# Patient Record
Sex: Female | Born: 1990
Health system: Southern US, Community
[De-identification: ages and names within clinical notes are randomized; demographics above are authoritative.]

## PROBLEM LIST (undated history)

## (undated) ENCOUNTER — Inpatient Hospital Stay (HOSPITAL_COMMUNITY): Payer: Self-pay

## (undated) DIAGNOSIS — R000 Tachycardia, unspecified: Secondary | ICD-10-CM

## (undated) DIAGNOSIS — E1151 Type 2 diabetes mellitus with diabetic peripheral angiopathy without gangrene: Secondary | ICD-10-CM

## (undated) DIAGNOSIS — E049 Nontoxic goiter, unspecified: Secondary | ICD-10-CM

## (undated) DIAGNOSIS — E109 Type 1 diabetes mellitus without complications: Secondary | ICD-10-CM

## (undated) DIAGNOSIS — E11649 Type 2 diabetes mellitus with hypoglycemia without coma: Secondary | ICD-10-CM

## (undated) DIAGNOSIS — B009 Herpesviral infection, unspecified: Secondary | ICD-10-CM

## (undated) DIAGNOSIS — E1043 Type 1 diabetes mellitus with diabetic autonomic (poly)neuropathy: Secondary | ICD-10-CM

## (undated) DIAGNOSIS — E0590 Thyrotoxicosis, unspecified without thyrotoxic crisis or storm: Secondary | ICD-10-CM

## (undated) DIAGNOSIS — E1042 Type 1 diabetes mellitus with diabetic polyneuropathy: Secondary | ICD-10-CM

## (undated) DIAGNOSIS — R5600 Simple febrile convulsions: Secondary | ICD-10-CM

## (undated) HISTORY — DX: Tachycardia, unspecified: R00.0

## (undated) HISTORY — DX: Type 1 diabetes mellitus without complications: E10.9

## (undated) HISTORY — DX: Simple febrile convulsions: R56.00

## (undated) HISTORY — DX: Type 2 diabetes mellitus with hypoglycemia without coma: E11.649

## (undated) HISTORY — DX: Nontoxic goiter, unspecified: E04.9

## (undated) HISTORY — DX: Type 1 diabetes mellitus with diabetic autonomic (poly)neuropathy: E10.43

## (undated) HISTORY — DX: Type 2 diabetes mellitus with diabetic peripheral angiopathy without gangrene: E11.51

## (undated) HISTORY — PX: ABCESS DRAINAGE: SHX399

## (undated) HISTORY — PX: OTHER SURGICAL HISTORY: SHX169

## (undated) HISTORY — DX: Type 1 diabetes mellitus with diabetic polyneuropathy: E10.42

---
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
## 2012-01-04 ENCOUNTER — Ambulatory Visit: Payer: BC Managed Care – PPO | Admitting: "Endocrinology

## 2012-01-22 ENCOUNTER — Encounter: Payer: Self-pay | Admitting: "Endocrinology

## 2012-01-22 DIAGNOSIS — E11649 Type 2 diabetes mellitus with hypoglycemia without coma: Secondary | ICD-10-CM | POA: Insufficient documentation

## 2012-01-22 DIAGNOSIS — R5600 Simple febrile convulsions: Secondary | ICD-10-CM | POA: Insufficient documentation

## 2012-01-22 DIAGNOSIS — E1151 Type 2 diabetes mellitus with diabetic peripheral angiopathy without gangrene: Secondary | ICD-10-CM | POA: Insufficient documentation

## 2012-01-22 DIAGNOSIS — R000 Tachycardia, unspecified: Secondary | ICD-10-CM | POA: Insufficient documentation

## 2012-01-22 DIAGNOSIS — E1042 Type 1 diabetes mellitus with diabetic polyneuropathy: Secondary | ICD-10-CM | POA: Insufficient documentation

## 2012-01-22 DIAGNOSIS — E1043 Type 1 diabetes mellitus with diabetic autonomic (poly)neuropathy: Secondary | ICD-10-CM | POA: Insufficient documentation

## 2012-01-22 DIAGNOSIS — E109 Type 1 diabetes mellitus without complications: Secondary | ICD-10-CM | POA: Insufficient documentation

## 2012-01-22 DIAGNOSIS — E049 Nontoxic goiter, unspecified: Secondary | ICD-10-CM | POA: Insufficient documentation

## 2012-01-22 NOTE — Progress Notes (Addendum)
Subjective:  Patient Name: [name] Date of Birth: 09/16/1991  MRN: [id number]  [name]  presents to the office today for follow-up evaluation and management of her type 1 diabetes mellitus, hypoglycemia, goiter, peripheral neuropathy, autonomic neuropathy, tachycardia, and angiopathy.  HISTORY OF PRESENT ILLNESS:   [name] is a 21 y.o. Caucasian young woman.   [name] was accompanied by her maternal grandmother.  1. The patient was first referred to me on 11/11/09 by Dr. Rollen Sox, of [facility], for evaluation and management of type 1 diabetes mellitus. The patient was 21 years old.   A. Just before her [age] birthday, the patient was diagnosed with new onset type 1 diabetes mellitus. She was admitted to [facility] and then referred to the [facility] pediatric endocrine clinic. She went on a Cozmo pump at about age 12. At about age 47 she had problems with the pump and stopped pump therapy. At the time of her first visit with me she was taking 17 units of Lantus at night most days, but only 16 units per night during menses. Her insulin carbohydrate ratio for NovoLog was one unit for every 10 g of carbohydrates. Her insulin sensitivity factor for NovoLog was one unit for every 100 points above 150. She often adjusted NovoLog doses to preclude hypoglycemia occurring during activity. Past medical history was positive for IgA nephropathy that was in remission. She had had a renal biopsy at age 24. She also had had incision and drainage of a staphylococcal facial abscess. She underwent menarche at age 2/12. Her cycles were regular. She was on enalapril for renal protection. Family history was positive for type 2 diabetes mellitus in the paternal grandfather and paternal great-grandmother. Maternal grandmother took thyroid medication for hypothyroidism. She never had any thyroid surgery or radiation to her thyroid. Both the maternal grandmother and the paternal  grandfather had MIs. The father had a stroke at age 17. Paternal grandfather also had a stroke. The father reportedly had hypertension and severe carotid artery disease.  B. On examination, her height was at the 35th percentile and her weight was at the 50th percentile. Her blood pressure was 140/80. Her hemoglobin A1c was 8.9%. She had a 20-25 g goiter. She had trace DP and PT pulses. Sensation to touch was slightly decreased in both heels. Sensation to monofilament was slightly decreased in the right heel. Laboratory test were then performed. CMP was normal. Cholesterol was 178, triglycerides 69, HDL 55, LDL 109. Her TSH was 2.31. Free T4 was 1.2. Free T3 was 2.8. Her microalbumin/creatinine ratio was 123.2 (normal less than 30). I put her on our small graduated snack plan. I asked her to subtract 50-100 points from the next blood sugar when she exercised. 2. During the past two years, her blood sugars have generally been higher and more erratic than I wished. She has also had frequent low blood sugars. On 06/04/10 I changed her NovoLog plan. Her correction dose was one unit for every 30 points of blood sugar greater than 150. Her food dose was one unit unit for every 10 g of carbohydrates. She remained on the small bedtime snack plan and on 17 units of Lantus. The patient's last [facility] visit was on 03/02/11. At that visit, her physician assistant changed her to a correction dose of one unit for every 50 points of blood sugar greater than 150 due to concerns about frequent hypoglycemia. In the interim, she has been seeing an allergist, Dr. Oren Bracket, in  [place]. She is allergic to most environmentals. She had to be hospitalized for a severe case of urticaria. She now takes Allegra, Zyrtec, and a nasal spray. She remains on Lantus 17 units at bedtime and NovoLog by her 2 component method. She has had recurrent tonsillitis and strep throats, so is scheduled to have a tonsillectomy performed in the near future.  She'll also have 4 wisdom teeth removed as well. 3. Pertinent Review of Systems:  Constitutional: The patient feels "good ". The patient seems healthy and active. Eyes: Vision seems to be good. There are no recognized eye problems. She had a dilated eye exam 2 weeks ago. Neck: The patient has no complaints of anterior neck swelling, soreness, tenderness, pressure, discomfort, or difficulty swallowing.   Heart: Heart rate increases with exercise or other physical activity. The patient has no complaints of palpitations, irregular heart beats, chest pain, or chest pressure.   Gastrointestinal: Bowel movents seem normal. The patient has no complaints of excessive hunger, acid reflux, upset stomach, stomach aches or pains, diarrhea, or constipation.  Legs: Muscle mass and strength seem normal. There are no complaints of numbness, tingling, burning, or pain. No edema is noted.  Feet: There are no obvious foot problems. There are no complaints of numbness, tingling, burning, or pain. No edema is noted. Neurologic: There are no recognized problems with muscle movement and strength, sensation, or coordination. GYN: LMP was 2 days ago. She's had irregular cycles and irregular uterine bleeding. Hypoglycemia: She had a few low blood sugars in the mornings, but none have been severe. 4. BG printout: The patient may go 8 days without checking blood sugars. She almost always misses her bedtime blood sugar checks. Blood sugars tend to be higher during menses.   PAST MEDICAL, FAMILY, AND SOCIAL HISTORY  Past Medical History  Diagnosis Date  . Type 1 diabetes mellitus in patient age 58-19 years with HbA1C goal below 7.5   . Hypoglycemia associated with diabetes   . Goiter   . Diabetic peripheral neuropathy associated with type 1 diabetes mellitus   . Type 1 diabetes mellitus with diabetic autonomic neuropathy   . Diabetic peripheral angiopathy   . Tachycardia   . Febrile seizures     Seizures occurred at  about 33/-45 months of age.    Family History  Problem Relation Age of Onset  . Hypertension Father   . Thyroid disease Maternal Grandmother     Hypothyroid and takes thyroid medication. No prior thyroid surgery her thyroid  radiation.  Marland Kitchen Heart disease Maternal Grandmother   . Diabetes Paternal Grandfather     T2 DM  . Heart disease Paternal Grandfather     Current outpatient prescriptions:Fexofenadine HCl (ALLEGRA PO), Take by mouth.  , Disp: , Rfl: ;  insulin aspart (NOVOLOG PENFILL) 100 UNIT/ML injection, Inject 25 Units into the skin 4 (four) times daily -  before meals and at bedtime., Disp: 15 mL, Rfl: 6;  Insulin Aspart (NOVOLOG [place]), Inject into the skin.  , Disp: , Rfl:  insulin glargine (LANTUS SOLOSTAR) 100 UNIT/ML injection, Inject 50 Units into the skin 1 day or 1 dose., Disp: 15 mL, Rfl: 6;  LANTUS SOLOSTAR 100 UNIT/ML injection, INJECT 17 UNITS AT BEDTIME OR AS DIRECTED, Disp: 15 Syringe, Rfl: 1  Allergies as of 10/04/2011 - Review Complete 10/04/2011  Allergen Reaction Noted  . Sulfa antibiotics Nausea And Vomiting 04/30/2011     reports that she has never smoked. She has never used smokeless tobacco. Pediatric History  Patient Guardian Status  . Mother:  Hill,Melissa   Other Topics Concern  . Not on file   Social History Narrative  . No narrative on file    1. School and Family: She will start [employer] in January. 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  2. Activities: She is not involved in any regular physical activities.  3. Primary Care Provider: Dr. Sharman Crate Hamrick  ROS: There are no other significant problems involving [place]'s other body systems.   Objective:  Vital Signs:  BP 112/78  Pulse 116  Ht 5' 3.62" (1.616 m)  Wt 117 lb 6.4 oz (53.252 kg)  BMI 20.39 kg/m2   Ht Readings from Last 3 Encounters:  10/04/11 5' 3.62" (1.616 m) (40.12%*)   * Growth percentiles are based on CDC 2-20  Years data.   Wt Readings from Last 3 Encounters:  10/04/11 117 lb 6.4 oz (53.252 kg) (32.95%*)   * Growth percentiles are based on CDC 2-20 Years data.   Body surface area is 1.55 meters squared. 40.12%ile based on CDC 2-20 Years stature-for-age data. 32.95%ile based on CDC 2-20 Years weight-for-age data.    PHYSICAL EXAM:  Constitutional: The patient appears healthy and well nourished. The patient's height and weight are normal for age. She is bright and alert. Head: The head is normocephalic. Face: The face appears normal.  Eyes: There is no obvious arcus or proptosis. Moisture appears normal. Mouth: The oropharynx and tongue appear normal. Oral moisture is normal. Neck: The neck appears to be visibly normal. No carotid bruits are noted. The thyroid gland is 20-25 grams in size. The left lobe is larger than the right lobe. The thyroid gland is relatively firm. The thyroid gland is not tender to palpation. Lungs: The lungs are clear to auscultation. Air movement is good. Heart: Heart rate and rhythm are regular. Heart sounds S1 and S2 are normal. I did not appreciate any pathologic cardiac murmurs. Abdomen: The abdomen appears to be normal in size for the patient's age. Bowel sounds are normal. There is no obvious hepatomegaly, splenomegaly, or other mass effect.  Arms: Muscle size and bulk are normal for age. Hands: There is no obvious tremor. Phalangeal and metacarpophalangeal joints are normal. Palmar muscles are normal for age. Palmar skin is normal. Palmar moisture is also normal. Legs: Muscles appear normal for age. No edema is present. Feet: Feet are normally formed. She has a healing abrasion of the right medial foot. Dorsalis pedal pulses are normal 1+ bilaterally. Neurologic: Strength is normal for age in both the upper and lower extremities. Muscle tone is normal. Sensation to touch is normal in both legs, but slightly decreased in the right heel.    LAB DATA: Hemoglobin  A1c today was 7.0%.   Assessment and Plan:   ASSESSMENT:  1. Type 1 diabetes mellitus: Hemoglobin A1c is surprisingly much better at this visit. She still has some big gaps in her BG printout.  2. Hypoglycemia: This occurs only occasionally. I suspect that she is having more frequent low blood sugars than she recognizes. 3. Goiter: Essentially unchanged in size from the last several visits. 4. Hashimoto's disease: Her thyroiditis is clinically quiescent. 5. Peripheral neuropathy: This is mild. 6.  Autonomic neuropathy and tachycardia: These problems are essentially unchanged. 7. Hypothyroidism: Her TSH test in July, ordered by Dr Barnetta Chapel, was slightly elevated, consistent with hypothyroidism. Since we know she has Hashimoto's disease, the  elevated TSH may have followed a flare-up of thyroiditis and may have returned to normal or may remain elevated, indicating permanent hypothyroidism. We need to re-check her TFTs.  PLAN:  1. Diagnostic: CMP, lipid panel, TFTs, TPO antibody, and urinary microalbumin/creatinine ratio per 2. Therapeutic: Continue Lantus at 17 units. Check BG's and take appropriate correction doses of NovoLog. 3. Patient education: I talked with her again about converting to an insulin pump. She is still not interested. 4. Follow-up: Return in about 3 months (around 01/04/2012).   Level of Service: This visit lasted in excess of 40 minutes. More than 50% of the visit was devoted to counseling.  David Stall, MD  ADDENDUM 1. Lab results from 10/04/11: CMP was normal except for a glucose of 215. Cholesterol was 179, triglycerides 79, HDL 57, and LDL 106. TSH was 0.8-8. Free T4 was 1.22. Free T3 was 2.9. Her urinary microalbumin/creatinine ratio was 49.8. 2. Her lipid panel is essentially the same as it was back and 11/15/09. I believe that if she gets the blood sugars under better control, her lipid levels will improve. 3. Her thyroid tests are now within normal. If  anything her TSH is now on the low end of the normal range. Her free T4 and free T3 are essentially the same as they were on 11/12/11/11/10, but have fluctuated in between. As noted above, the swings in thyroid tests are consistent with flareups of thyroiditis. 4. Her urinary microalbumin/creatinine ratio is elevated, but it is markedly improved from 85.7 and on 11/01/10 and 123.2 on 11/11/09. She stopped enalapril therapy in late 2011 due to concerns that it made her dizzy. Our PA tried to re-start her on a half-dose in November, but the patient did not do so. She needs to resume ACE inhibitor therapy or begun ARB therapy. David Stall

## 2012-01-23 ENCOUNTER — Encounter: Payer: Self-pay | Admitting: "Endocrinology

## 2012-02-03 ENCOUNTER — Telehealth: Payer: Self-pay | Admitting: "Endocrinology

## 2012-02-03 ENCOUNTER — Ambulatory Visit (INDEPENDENT_AMBULATORY_CARE_PROVIDER_SITE_OTHER): Payer: BC Managed Care – PPO | Admitting: "Endocrinology

## 2012-02-03 ENCOUNTER — Ambulatory Visit: Payer: BC Managed Care – PPO | Admitting: Pediatric Endocrinology

## 2012-02-03 ENCOUNTER — Encounter: Payer: Self-pay | Admitting: "Endocrinology

## 2012-02-03 VITALS — BP 118/82 | HR 118 | Wt 118.0 lb

## 2012-02-03 DIAGNOSIS — E1142 Type 2 diabetes mellitus with diabetic polyneuropathy: Secondary | ICD-10-CM

## 2012-02-03 DIAGNOSIS — N058 Unspecified nephritic syndrome with other morphologic changes: Secondary | ICD-10-CM

## 2012-02-03 DIAGNOSIS — E1149 Type 2 diabetes mellitus with other diabetic neurological complication: Secondary | ICD-10-CM

## 2012-02-03 DIAGNOSIS — E1169 Type 2 diabetes mellitus with other specified complication: Secondary | ICD-10-CM

## 2012-02-03 DIAGNOSIS — IMO0002 Reserved for concepts with insufficient information to code with codable children: Secondary | ICD-10-CM

## 2012-02-03 DIAGNOSIS — E1129 Type 2 diabetes mellitus with other diabetic kidney complication: Secondary | ICD-10-CM

## 2012-02-03 DIAGNOSIS — E049 Nontoxic goiter, unspecified: Secondary | ICD-10-CM

## 2012-02-03 DIAGNOSIS — R809 Proteinuria, unspecified: Secondary | ICD-10-CM | POA: Insufficient documentation

## 2012-02-03 DIAGNOSIS — E1065 Type 1 diabetes mellitus with hyperglycemia: Secondary | ICD-10-CM

## 2012-02-03 DIAGNOSIS — I10 Essential (primary) hypertension: Secondary | ICD-10-CM | POA: Insufficient documentation

## 2012-02-03 DIAGNOSIS — G609 Hereditary and idiopathic neuropathy, unspecified: Secondary | ICD-10-CM

## 2012-02-03 DIAGNOSIS — E063 Autoimmune thyroiditis: Secondary | ICD-10-CM

## 2012-02-03 DIAGNOSIS — E1143 Type 2 diabetes mellitus with diabetic autonomic (poly)neuropathy: Secondary | ICD-10-CM

## 2012-02-03 DIAGNOSIS — E038 Other specified hypothyroidism: Secondary | ICD-10-CM

## 2012-02-03 DIAGNOSIS — E11649 Type 2 diabetes mellitus with hypoglycemia without coma: Secondary | ICD-10-CM

## 2012-02-03 DIAGNOSIS — G909 Disorder of the autonomic nervous system, unspecified: Secondary | ICD-10-CM

## 2012-02-03 DIAGNOSIS — R000 Tachycardia, unspecified: Secondary | ICD-10-CM

## 2012-02-03 DIAGNOSIS — IMO0001 Reserved for inherently not codable concepts without codable children: Secondary | ICD-10-CM

## 2012-02-03 DIAGNOSIS — E1165 Type 2 diabetes mellitus with hyperglycemia: Secondary | ICD-10-CM

## 2012-02-03 LAB — POCT GLYCOSYLATED HEMOGLOBIN (HGB A1C): Hemoglobin A1C: 11.8

## 2012-02-03 MED ORDER — LISINOPRIL 2.5 MG PO TABS: 2.5000 mg | ORAL_TABLET | Freq: Every day | ORAL | Status: DC

## 2012-02-03 MED ORDER — INSULIN GLARGINE 100 UNIT/ML ~~LOC~~ SOLN: 18.0000 [IU] | SUBCUTANEOUS | Status: DC

## 2012-02-03 MED ORDER — INSULIN ASPART 100 UNIT/ML ~~LOC~~ SOLN: 25.0000 [IU] | Freq: Three times a day (TID) | SUBCUTANEOUS | Status: DC

## 2012-02-03 MED ORDER — GLUCOSE BLOOD VI STRP: ORAL_STRIP | Status: DC

## 2012-02-03 NOTE — Patient Instructions (Signed)
Followup visit in 3 months. Please check blood glucose values in all mealtimes and at bedtime. Please take Lantus and NovoLog insulin as prescribed.

## 2012-02-03 NOTE — Progress Notes (Addendum)
Subjective:  Patient Name: [name] Date of Birth: 05-26-1991  MRN: [id number]  [name]  presents to the office today for follow-up evaluation and management of her type 1 diabetes mellitus, hypoglycemia, goiter, peripheral neuropathy, autonomic neuropathy, tachycardia, and angiopathy.  HISTORY OF PRESENT ILLNESS:   [name] is a 21 y.o. Caucasian young woman.   [name] was unaccompanied.   1. The patient was first referred to me on 11/11/09 by Dr. Rollen Sox, of [facility], for evaluation and management of type 1 diabetes mellitus. The patient was 21 years old.   A. Just before her [age] birthday, the patient was diagnosed with new onset type 1 diabetes mellitus. She was admitted to [facility] and then referred to the [facility] pediatric endocrine clinic. She went on a Cozmo pump at about age 76. At about age 38 she had problems with the pump and stopped pump therapy. At the time of her first visit with me she was taking 17 units of Lantus at night most days, but only 16 units per night during menses. Her insulin carbohydrate ratio for NovoLog was one unit for every 10 g of carbohydrates. Her insulin sensitivity factor for NovoLog was one unit for every 100 points above 150. She often adjusted NovoLog doses to preclude hypoglycemia occurring during activity. Past medical history was positive for IgA nephropathy that was in remission. She had had a renal biopsy at age 6. She also had had incision and drainage of a staphylococcal facial abscess. She underwent menarche at age 20/12. Her cycles were regular. She was on enalapril for renal protection. Family history was positive for type 2 diabetes mellitus in the paternal grandfather and paternal great-grandmother. Maternal grandmother took thyroid medication for hypothyroidism. She never had any thyroid surgery or radiation to her thyroid. Both the maternal grandmother and the paternal grandfather had MIs. The father  had a stroke at age 84. Paternal grandfather also had a stroke. The father reportedly had hypertension and severe carotid artery disease.  B. On examination, her height was at the 35th percentile and her weight was at the 50th percentile. Her blood pressure was 140/80. Her hemoglobin A1c was 8.9%. She had a 20-25 g goiter. She had trace DP and PT pulses. Sensation to touch was slightly decreased in both heels. Sensation to monofilament was slightly decreased in the right heel. Laboratory test were then performed. CMP was normal. Cholesterol was 178, triglycerides 69, HDL 55, LDL 109. Her TSH was 2.31. Free T4 was 1.2. Free T3 was 2.8. Her microalbumin/creatinine ratio was 123.2 (normal less than 30). I put her on our small graduated snack plan. I asked her to subtract 50-100 points from the next blood sugar when she exercised. 2. During the past two years, her blood sugars have generally been higher and more erratic than I wished. She has also had frequent low blood sugars. On 06/04/10 I changed her NovoLog plan. Her new correction dose was one unit for every 30 points of blood sugar greater than 150. Her new food dose was one unit unit for every 15 g of carbohydrates. She remained on the small bedtime snack plan and on 18 units of Lantus. The patient's last [facility] visit was on 10/04/11. She is still seeing an allergist, Dr. Oren Bracket, in [place]. She is allergic to most environmentals. She now takes her nasal spray regularly and her Allegra and Zyrtec as needed. She remains on Lantus 18 units at bedtime and NovoLog by her 2  component method using her Medical laboratory scientific officer. She has had recurrent tonsillitis and strep throats, so should have a tonsillectomy performed in the  future. She will probably have her 4 wisdom teeth removed in the future as well. 3. Pertinent Review of Systems:  Constitutional: The patient feels "pretty good ". The patient seems healthy and active. Eyes: Vision seems to be good. There are no  recognized eye problems. She had a dilated eye exam in November Neck: The patient has no complaints of anterior neck swelling, soreness, tenderness, pressure, discomfort, or difficulty swallowing.   Heart: Heart rate increases with exercise or other physical activity. The patient has no complaints of palpitations, irregular heart beats, chest pain, or chest pressure.   Gastrointestinal: Bowel movents seem normal. The patient has no complaints of excessive hunger, acid reflux, upset stomach, stomach aches or pains, diarrhea, or constipation.  Legs: Muscle mass and strength seem normal. There are no complaints of numbness, tingling, burning, or pain. No edema is noted.  Feet: She damaged her left great toe nail and the nail was mostly torn off. She applied an "antibacterial" nail polish and the area is slowly healing. The toenail fragment is gradually growing out. There are no complaints of numbness, tingling, burning, or pain. No edema is noted. Neurologic: There are no recognized problems with muscle movement and strength, sensation, or coordination. GYN: LMP was in October. She has an Implanon implant. Hypoglycemia: She had not had many low BGs. She is "terrified of low blood sugars" so she tends to keep her BGs higher than she should. At bedtime when she takes her Lantus, she usually eats more than her HS snack plan calls for in order to avoid hypoglycemia. 4. BG printout: She is checking her BGs much more frequently, mostly 3-4 times per day. She had one low BG of 65 3 weeks ago.  PAST MEDICAL, FAMILY, AND SOCIAL HISTORY  Past Medical History  Diagnosis Date  . Type 1 diabetes mellitus in patient age 30-19 years with HbA1C goal below 7.5   . Hypoglycemia associated with diabetes   . Goiter   . Diabetic peripheral neuropathy associated with type 1 diabetes mellitus   . Type 1 diabetes mellitus with diabetic autonomic neuropathy   . Diabetic peripheral angiopathy   . Tachycardia   . Febrile  seizures     Seizures occurred at about 63/-81 months of age.  . Diabetes mellitus type I     Family History  Problem Relation Age of Onset  . Hypertension Father   . Thyroid disease Maternal Grandmother     Hypothyroid and takes thyroid medication. No prior thyroid surgery her thyroid  radiation.  Marland Kitchen Heart disease Maternal Grandmother   . Diabetes Paternal Grandfather     T2 DM  . Heart disease Paternal Grandfather     Current outpatient prescriptions:insulin aspart (NOVOLOG PENFILL) 100 UNIT/ML injection, Inject 25 Units into the skin 4 (four) times daily -  before meals and at bedtime., Disp: 15 mL, Rfl: 6;  insulin glargine (LANTUS) 100 UNIT/ML injection, Inject 18 Units into the skin 1 day or 1 dose., Disp: , Rfl: ;  DISCONTD: insulin glargine (LANTUS SOLOSTAR) 100 UNIT/ML injection, Inject 50 Units into the skin 1 day or 1 dose., Disp: 15 mL, Rfl: 6 Fexofenadine HCl (ALLEGRA PO), Take by mouth.  , Disp: , Rfl: ;  Insulin Aspart (NOVOLOG Lemay), Inject into the skin.  , Disp: , Rfl: ;  LANTUS SOLOSTAR 100 UNIT/ML injection, INJECT 17 UNITS AT  BEDTIME OR AS DIRECTED, Disp: 15 Syringe, Rfl: 1  Allergies as of 02/03/2012 - Review Complete 02/03/2012  Allergen Reaction Noted  . Sulfa antibiotics Nausea And Vomiting 04/30/2011     reports that she has never smoked. She has never used smokeless tobacco. Pediatric History  Patient Guardian Status  . Mother:  Mckinney,Jennifer   Other Topics Concern  . Not on file   Social History Narrative   Lives with mom, stepdad, sister. Attends [employer].      1. School and Family: She has classes at [employer] from 8 AM to 1 PM Monday-Thursday. 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  2. Activities: She is not involved in any regular physical activities.  3. Primary Care Provider: Dr. Sharman Crate Hamrick  ROS: There are no other significant problems involving [name]'s other body systems.   Objective:  Vital Signs:  BP  118/82  Pulse 118  Wt 118 lb (53.524 kg)   Ht Readings from Last 3 Encounters:  10/04/11 5' 3.62" (1.616 m) (40.12%*)   * Growth percentiles are based on CDC 2-20 Years data.   Wt Readings from Last 3 Encounters:  02/03/12 118 lb (53.524 kg) (32.72%*)  10/04/11 117 lb 6.4 oz (53.252 kg) (32.95%*)   * Growth percentiles are based on CDC 2-20 Years data.   There is no height on file to calculate BSA. No height on file. 32.72%ile based on CDC 2-20 Years weight-for-age data.    PHYSICAL EXAM:  Constitutional: The patient appears healthy and well nourished. The patient's height and weight are normal for age. She is bright and alert. Head: The head is normocephalic. Face: The face appears normal.  Eyes: There is no obvious arcus or proptosis. Moisture appears normal. Mouth: The oropharynx and tongue appear normal. Oral moisture is normal. Neck: The neck appears to be visibly normal. No carotid bruits are noted. The thyroid gland is 20-25 grams in size. The left lobe is larger than the right lobe. The thyroid gland is relatively firm. The thyroid gland is not tender to palpation. Lungs: The lungs are clear to auscultation. Air movement is good. Heart: Heart rate and rhythm are regular. Heart sounds S1 and S2 are normal. I did not appreciate any pathologic cardiac murmurs. Abdomen: The abdomen appears to be normal in size for the patient's age. Bowel sounds are normal. There is no obvious hepatomegaly, splenomegaly, or other mass effect.  Arms: Muscle size and bulk are normal for age. Hands: There is no obvious tremor. Phalangeal and metacarpophalangeal joints are normal. Palmar muscles are normal for age. Palmar skin is normal. Palmar moisture is also normal. Legs: Muscles appear normal for age. No edema is present. Feet: Feet are normally formed. She has a Band-Aid around her left great toe covering the nail bed. Dorsalis pedal pulses are faint 1+ on the right and 1+ on the  left. Neurologic: Strength is normal for age in both the upper and lower extremities. Muscle tone is normal. Sensation to touch is normal in both legs, but slightly decreased in the right heel.    LAB DATA: Hemoglobin A1c today was 11.8%.         Lab data 10/04/11: CMP was normal except for glucose of 2:15. Cholesterol was 179, triglycerides 79, HDL 57, and LDL 106.   Assessment and Plan:   ASSESSMENT:  1. Type 1 diabetes mellitus: Hemoglobin A1c is much higher, but she has not been having the  low BG symptoms that she had before. She has had a large number of acceptable BGs. She is doing a much better job of checking BGs and taking better care of herself.  2. Hypoglycemia: This occurs only occasionally. The one documented episode occurred before lunch on a day that she had not checked BG at breakfast or eaten breakfast.   3. Goiter: Essentially unchanged in size from the last several visits.  4. Hashimoto's disease: Her thyroiditis is clinically quiescent. 5. Peripheral neuropathy: This is mild. 6.  Autonomic neuropathy and tachycardia: These problems are essentially unchanged. Both forms of neuropathy will correct if she can keep the BGs in mostly a near-normal range. 7. Hypothyroidism: Her TSH test in July, ordered by Dr Barnetta Chapel, was slightly elevated, consistent with hypothyroidism. She was euthyroid in October. Since we know she has Hashimoto's disease, the elevated TSH must have followed a flare-up of thyroiditis, but has since returned to normal. We need to recheck her TFTs every 3-6 months. She will become hypothyroid eventually. 8. Microalbuminuria: Although her microalbumin/creatinine ratio was elevated at 49.8 (normal less than 30), this is a marked improvement from a value of 123.2 on 11/15/09 and 85.7 on 10/25/10. It is possible that this degree of proteinuria is do to her previous IgA nephropathy rather than to diabetes alone. Treatment with an ACE inhibitor might help. 9.  Hypertension: Patient's diastolic blood pressure was slightly elevated today. She may benefit from an ACE inhibitor do to both its antihypertensive effect and its renal protective effect. She stopped using enalapril several years ago because of passing out.A lisinopril dose of 2.5 mg might work out well  PLAN:  1. Diagnostic: CMP, lipid panel, TFTs, TPO antibody, and urinary microalbumin/creatinine prior to next visit. 2. Therapeutic: Continue Lantus at 18 units. Check BG's and take appropriate correction doses of NovoLog. Follow the HS snack plan. Had lisinopril, 2.5 mg per day. 3. Patient education: I talked with her again about converting to an insulin pump. She is interested now. I gave her a [employer] pump packet. 4. Follow-up: 3 months   Level of Service: This visit lasted in excess of 40 minutes. More than 50% of the visit was devoted to counseling.  David Stall, MD

## 2012-02-03 NOTE — Telephone Encounter (Signed)
I tried to contact the patient on her cell phone, but she was not available. I did talk with her mother. I explained that with all of the trouble we had today trying to order her medications through the computer, I had forgotten to order the lisinopril, 2.5 mg/day. I explained to the mother that this is the lowest dose I can prescribe, so [name] is unlikely to have hypotension. If she does feel as if her BP is too low, however, she can always cut the pill in half. Mother stated that she would pass on the information to Balta.  David Stall

## 2012-03-07 ENCOUNTER — Ambulatory Visit: Payer: BC Managed Care – PPO | Admitting: "Endocrinology

## 2012-03-20 ENCOUNTER — Other Ambulatory Visit: Payer: Self-pay | Admitting: "Endocrinology

## 2012-04-12 ENCOUNTER — Other Ambulatory Visit: Payer: Self-pay | Admitting: *Deleted

## 2012-04-12 DIAGNOSIS — E1065 Type 1 diabetes mellitus with hyperglycemia: Secondary | ICD-10-CM

## 2012-04-12 DIAGNOSIS — E039 Hypothyroidism, unspecified: Secondary | ICD-10-CM

## 2012-05-03 ENCOUNTER — Ambulatory Visit (INDEPENDENT_AMBULATORY_CARE_PROVIDER_SITE_OTHER): Payer: BC Managed Care – PPO | Admitting: "Endocrinology

## 2012-05-03 ENCOUNTER — Encounter: Payer: Self-pay | Admitting: "Endocrinology

## 2012-05-03 VITALS — BP 123/77 | HR 125 | Wt 125.2 lb

## 2012-05-03 DIAGNOSIS — R809 Proteinuria, unspecified: Secondary | ICD-10-CM

## 2012-05-03 DIAGNOSIS — G909 Disorder of the autonomic nervous system, unspecified: Secondary | ICD-10-CM

## 2012-05-03 DIAGNOSIS — E1142 Type 2 diabetes mellitus with diabetic polyneuropathy: Secondary | ICD-10-CM

## 2012-05-03 DIAGNOSIS — E11649 Type 2 diabetes mellitus with hypoglycemia without coma: Secondary | ICD-10-CM

## 2012-05-03 DIAGNOSIS — R000 Tachycardia, unspecified: Secondary | ICD-10-CM

## 2012-05-03 DIAGNOSIS — G609 Hereditary and idiopathic neuropathy, unspecified: Secondary | ICD-10-CM

## 2012-05-03 DIAGNOSIS — E1149 Type 2 diabetes mellitus with other diabetic neurological complication: Secondary | ICD-10-CM

## 2012-05-03 DIAGNOSIS — E063 Autoimmune thyroiditis: Secondary | ICD-10-CM

## 2012-05-03 DIAGNOSIS — E1143 Type 2 diabetes mellitus with diabetic autonomic (poly)neuropathy: Secondary | ICD-10-CM

## 2012-05-03 DIAGNOSIS — E049 Nontoxic goiter, unspecified: Secondary | ICD-10-CM

## 2012-05-03 DIAGNOSIS — E1169 Type 2 diabetes mellitus with other specified complication: Secondary | ICD-10-CM

## 2012-05-03 DIAGNOSIS — E038 Other specified hypothyroidism: Secondary | ICD-10-CM

## 2012-05-03 DIAGNOSIS — E1065 Type 1 diabetes mellitus with hyperglycemia: Secondary | ICD-10-CM

## 2012-05-03 NOTE — Patient Instructions (Signed)
All up visit in 4 months. Please have lab tests done on next 3 weeks. In 3-4 weeks please bring in the BG meter for download. His let us know immediately if you decide to order an insulin pump.

## 2012-05-03 NOTE — Progress Notes (Addendum)
Subjective:  Patient Name: [name] Date of Birth: Sep 20, 1991  MRN: [id number]  [name]  presents to the office today for follow-up evaluation and management of her type 1 diabetes mellitus, hypoglycemia, goiter, peripheral neuropathy, autonomic neuropathy, tachycardia, and angiopathy.  HISTORY OF PRESENT ILLNESS:   [name] is a 21 y.o. Caucasian young woman.   [name] was unaccompanied.   1. The patient was first referred to me on 11/11/09 by Dr. Burnell Blanks, of [facility], for evaluation and management of type 1 diabetes mellitus. The patient was 21 years old.   A. Just before her [age] birthday, the patient was diagnosed with new onset type 1 diabetes mellitus. She was admitted to [facility] and then referred to the [facility] pediatric endocrine clinic. She went on a Cozmo pump at about age 10. At about age 43 she had problems with the pump and stopped pump therapy. At the time of her first visit with me she was taking 17 units of Lantus at night most days, but only 16 units per night during menses. Her insulin carbohydrate ratio for NovoLog was one unit for every 10 g of carbohydrates. Her insulin sensitivity factor for NovoLog was one unit for every 100 points above 150. She often adjusted NovoLog doses to preclude hypoglycemia occurring during activity. Past medical history was positive for IgA nephropathy that was in remission. She had had a renal biopsy at age 45. She also had had incision and drainage of a staphylococcal facial abscess. She underwent menarche at age 29/12. Her cycles were regular. She was on enalapril for renal protection. Family history was positive for type 2 diabetes mellitus in the paternal grandfather and paternal great-grandmother. Maternal grandmother took thyroid medication for hypothyroidism. The MGM had never had any thyroid surgery or radiation to her thyroid. Both the maternal grandmother and the paternal grandfather had MIs. The  father had a stroke at age 63. Paternal grandfather also had a stroke. The father reportedly had hypertension and severe carotid artery disease.  B. On examination, her height was at the 35th percentile and her weight was at the 50th percentile. Her blood pressure was 140/80. Her hemoglobin A1c was 8.9%. She had a 20-25 g goiter. She had trace DP and PT pulses. Sensation to touch was slightly decreased in both heels. Sensation to monofilament was slightly decreased in the right heel. Laboratory test were then performed. CMP was normal. Cholesterol was 178, triglycerides 69, HDL 55, LDL 109. Her TSH was 2.31. Free T4 was 1.2. Free T3 was 2.8. Her microalbumin/creatinine ratio was 123.2 (normal less than 30). I put her on our small graduated snack plan. I asked her to subtract 50-100 points from the next blood sugar when she exercised. 2. During the past three years, her blood sugars have generally been higher and more erratic than I wished. She has also had frequent low blood sugars. On 06/04/10 I changed her NovoLog plan. Her new correction dose was one unit for every 30 points of blood sugar greater than 150. Her new food dose was one unit for every 15 g of carbohydrates. She remained on the small bedtime snack plan and on 18 units of Lantus.  3. The patient's last [facility] visit was on 02/03/12. In the interim, the left great toe nail that had come off at last visit has not healed and may be infected. She has not yet had her planned tonsillectomy or wisdom teeth removal. She stopped the lisinopril because she had sensations  of uncomfortable floating and got lightheaded at work one day and passed out. These symptoms resolved after stopping the lisinopril. She also went to a trampoline park, developed bad back pain afterward, and went to urgent care. She was told she had back strain and a slight case of scoliosis. She reduced Lantus to 17 units at bedtime about one week ago due to hypoglycemia episodes. She remains on  NovoLog by her 2 component method using her Medical laboratory scientific officer.  4. Pertinent Review of Systems:  Constitutional: The patient feels "pretty good". The patient seems healthy and active. Eyes: Vision seems to be good. There are no recognized eye problems. She had a dilated eye exam in November. Neck: The patient has no complaints of anterior neck swelling, soreness, tenderness, pressure, discomfort, or difficulty swallowing.   Heart: Heart rate increases with exercise or other physical activity. The patient has no complaints of palpitations, irregular heart beats, chest pain, or chest pressure.   Gastrointestinal: She is often excessively hungry and queasy. Whenever she has low BG that requires treatment she notes nausea and epigastric pain that can last for several hours after the BG comes up. Bowel movents seem normal. The patient has no complaints of excessive hunger, acid reflux, upset stomach, stomach aches or pains, diarrhea, or constipation.  Legs: Muscle mass and strength seem normal. There are no complaints of numbness, tingling, burning, or pain. No edema is noted.  Feet: As above. There are no complaints of numbness, tingling, burning, or pain. No edema is noted. Neurologic: There are no recognized problems with muscle movement and strength, sensation, or coordination. GYN: LMP was in October. She has an Implanon implant. Hypoglycemia: She had not had many low BGs since reducing Lantus to 17 units at HS. When she has low BGs they occur in the mornings upon awakening.  She is still "terrified of low blood sugars" so she tends to keep her BGs higher than she should. At bedtime when she takes her Lantus, she usually eats more than her HS snack plan calls for in order to avoid hypoglycemia. 5. BG printout: We could not download her old Ultra meter today.  BGs: 0200, breakfast (She does not eat breakfast at all.), lunch, dinner, bedtime: She often guesstimates her Novolog doses at mealtimes. 05/03/12:  xxx, 182, 291/185 05/02/12: xxx, 196, xxx, 169, 255 5.27/13: xxx, xxx, 77, 138, 141 04/30/12: xxx, xxx, 148, xxx, 144 04/29/12: xxx, xxx, xxx, 509/ hands washed 192,139 04/28/12: xxx, xxx, 157, xxx, 169  PAST MEDICAL, FAMILY, AND SOCIAL HISTORY  Past Medical History  Diagnosis Date  . Type 1 diabetes mellitus in patient age 65-19 years with HbA1C goal below 7.5   . Hypoglycemia associated with diabetes   . Goiter   . Diabetic peripheral neuropathy associated with type 1 diabetes mellitus   . Type 1 diabetes mellitus with diabetic autonomic neuropathy   . Diabetic peripheral angiopathy   . Tachycardia   . Febrile seizures     Seizures occurred at about 75/-71 months of age.  . Diabetes mellitus type I     Family History  Problem Relation Age of Onset  . Hypertension Father   . Heart disease Maternal Grandmother   . Diabetes Paternal Grandfather     T2 DM  . Heart disease Paternal Grandfather     Current outpatient prescriptions:glucose blood (ONE TOUCH ULTRA TEST) test strip, Check sugar 10 x daily, Disp: 300 each, Rfl: 6;  insulin aspart (NOVOLOG PENFILL) 100 UNIT/ML injection, Inject 25 Units  into the skin 4 (four) times daily -  before meals and at bedtime., Disp: 15 mL, Rfl: 6;  insulin glargine (LANTUS) 100 UNIT/ML injection, Inject 17 Units into the skin at bedtime. Inject 18 units at bedtime or as directed., Disp: , Rfl:  DISCONTD: insulin glargine (LANTUS SOLOSTAR) 100 UNIT/ML injection, Inject 18 units at bedtime or as directed., Disp: 15 mL, Rfl: 3;  Fexofenadine HCl (ALLEGRA PO), Take by mouth.  , Disp: , Rfl: ;  lisinopril (ZESTRIL) 2.5 MG tablet, Take 1 tablet (2.5 mg total) by mouth daily., Disp: 30 tablet, Rfl: 11;  DISCONTD: Insulin Aspart (NOVOLOG [place]), Inject into the skin.  , Disp: , Rfl:  DISCONTD: insulin glargine (LANTUS) 100 UNIT/ML injection, Inject 18 Units into the skin 1 day or 1 dose., Disp: 15 mL, Rfl: 6  Allergies as of 05/03/2012 - Review Complete  05/03/2012  Allergen Reaction Noted  . Sulfa antibiotics Nausea And Vomiting 04/30/2011     reports that she has never smoked. She has never used smokeless tobacco. Pediatric History  Patient Guardian Status  . Mother:  Hill,Melissa   Other Topics Concern  . Not on file   Social History Narrative   Lives with mom, stepdad, sister. Attends [employer].      1. School and Family: She still attends [employer] from 8 AM to 1 PM Monday-Thursday. 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  2. Activities: She is not involved in any regular physical activities.  3. Primary Care Provider: Dr. Sharman Crate Hamrick  ROS: There are no other significant problems involving Paige's other body systems.   Objective:  Vital Signs:  Wt 125 lb 3.2 oz (56.79 kg)   Ht Readings from Last 3 Encounters:  10/04/11 5' 3.62" (1.616 m) (40.12%*)   * Growth percentiles are based on CDC 2-20 Years data.   Wt Readings from Last 3 Encounters:  05/03/12 125 lb 3.2 oz (56.79 kg) (46.44%*)  02/03/12 118 lb (53.524 kg) (32.72%*)  10/04/11 117 lb 6.4 oz (53.252 kg) (32.95%*)   * Growth percentiles are based on CDC 2-20 Years data.  BP: 123/77     HR: 125 and 132  PHYSICAL EXAM: Constitutional: The patient appears healthy and well nourished. The patient's height and weight are normal for age. She is bright and alert. Head: The head is normocephalic. Face: The face appears normal.  Eyes: There is no obvious arcus or proptosis. Moisture appears normal. Mouth: The oropharynx and tongue appear normal. Oral moisture is normal. Neck: The neck appears to be visibly normal. No carotid bruits are noted. The thyroid gland is 23-25 grams in size. The left lobe is larger than the right lobe. The isthmus is also enlarged today. The consistency of the thyroid gland is normal . The thyroid gland is not tender to palpation. Lungs: The lungs are clear to auscultation. Air movement is good. Heart:  Heart rate and rhythm are regular. Heart sounds S1 and S2 are normal. I did not appreciate any pathologic cardiac murmurs. Abdomen: The abdomen appears to be normal in size for the patient's age. Bowel sounds are normal. There is no obvious hepatomegaly, splenomegaly, or other mass effect.  Arms: Muscle size and bulk are normal for age. Hands: There is no obvious tremor. Phalangeal and metacarpophalangeal joints are normal. Palmar muscles are normal for age. Palmar skin is normal. Palmar moisture is also normal. Legs: Muscles appear normal for age. No edema is present.  Feet: Feet are normally formed. She has the stump of the left great toe nail in place. The medial aspect of the stump is somewhat raised. She says that pus sometimes comes out from under that area. The toe does not look infected today. Dorsalis pedal pulses are faint 1+ on the right and 1+ on the left. Neurologic: Strength is normal for age in both the upper and lower extremities. Muscle tone is normal. Sensation to touch is normal in both legs, but slightly decreased in the heels.    LAB DATA: Hemoglobin A1c today was 11.8%, unchanged from February.         Lab data 04/12/12: Ordered but not yet done.   Assessment and Plan:   ASSESSMENT:  1. Type 1 diabetes mellitus: Hemoglobin A1c is still elevated, although her recent BG levels look better than the A1c. She has had a large number of acceptable BGs. Unfortunately, she appears to be checking BGs less frequently lately.  2. Hypoglycemia: This occurs only occasionally now.  3. Goiter: The thyroid is a bit larger today. The waxing and waning of the thyroid gland size is c/w evolving Hashimoto's disease.  4. Hashimoto's disease: Her thyroiditis is clinically quiescent. 5. Peripheral neuropathy: This is mild. 6.  Autonomic neuropathy and tachycardia: These problems are essentially unchanged. Both forms of neuropathy will correct if she can keep the BGs in mostly a near-normal range. 7.  Hypothyroidism: Her TSH test in July, ordered by Dr Barnetta Chapel, was slightly elevated, consistent with hypothyroidism. She was euthyroid in October. Since we know she has Hashimoto's disease, the elevated TSH must have followed a flare-up of thyroiditis, but has since returned to normal. We need to recheck her TFTs every 3-6 months. She will become hypothyroid eventually. 8. Microalbuminuria: Although her microalbumin/creatinine ratio last October was elevated at 49.8 (normal less than 30), this was a marked improvement from a value of 123.2 on 11/15/09 and 85.7 on 10/25/10. It is possible that this degree of proteinuria is due to her previous IgA nephropathy rather than to diabetes alone. Treatment with an ACE inhibitor could help, but she was not able to take lisinopril. We'll see what she does if she exercises.  9. Hypertension: Patient's diastolic blood pressure was slightly elevated today. As above.  PLAN:  1. Diagnostic: CMP, lipid panel, TFTs, TPO antibody, and urinary microalbumin/creatinine.  2. Therapeutic: Continue Lantus at 17 units. Need to download her other meter in 3-4 weeks. Check BG's and take appropriate correction doses of NovoLog. Follow the HS snack plan.  3. Patient education: I talked with her again about converting to an insulin pump. She is not interested now. She still has the pump packet I gave her before. 4. Follow-up: 4 months   Level of Service: This visit lasted in excess of 40 minutes. More than 50% of the visit was devoted to counseling.  David Stall, MD

## 2012-05-06 LAB — COMPREHENSIVE METABOLIC PANEL
ALT: 27 U/L (ref 0–35)
Albumin: 3.9 g/dL (ref 3.5–5.2)
CO2: 23 mEq/L (ref 19–32)
Calcium: 8.8 mg/dL (ref 8.4–10.5)
Chloride: 104 mEq/L (ref 96–112)
Glucose, Bld: 117 mg/dL — ABNORMAL HIGH (ref 70–99)
Potassium: 3.5 mEq/L (ref 3.5–5.3)
Sodium: 139 mEq/L (ref 135–145)
Total Bilirubin: 0.4 mg/dL (ref 0.3–1.2)
Total Protein: 6.5 g/dL (ref 6.0–8.3)

## 2012-05-06 LAB — MICROALBUMIN / CREATININE URINE RATIO
Creatinine, Urine: 72.4 mg/dL
Microalb Creat Ratio: 97.9 mg/g — ABNORMAL HIGH (ref 0.0–30.0)
Microalb, Ur: 7.09 mg/dL — ABNORMAL HIGH (ref 0.00–1.89)

## 2012-05-06 LAB — LIPID PANEL
Cholesterol: 202 mg/dL — ABNORMAL HIGH (ref 0–200)
Triglycerides: 105 mg/dL (ref <150)

## 2012-05-08 LAB — T4, FREE: Free T4: 1.2 ng/dL (ref 0.80–1.80)

## 2012-05-08 LAB — T3, FREE: T3, Free: 3.4 pg/mL (ref 2.3–4.2)

## 2012-05-08 LAB — THYROID PEROXIDASE ANTIBODY: Thyroperoxidase Ab SerPl-aCnc: <10 IU/mL (ref <35.0)

## 2012-07-20 ENCOUNTER — Other Ambulatory Visit: Payer: Self-pay | Admitting: "Endocrinology

## 2012-09-11 ENCOUNTER — Ambulatory Visit (INDEPENDENT_AMBULATORY_CARE_PROVIDER_SITE_OTHER): Payer: BC Managed Care – PPO | Admitting: "Endocrinology

## 2012-09-11 ENCOUNTER — Encounter: Payer: Self-pay | Admitting: "Endocrinology

## 2012-09-11 VITALS — BP 124/88 | HR 114 | Wt 125.3 lb

## 2012-09-11 DIAGNOSIS — E1029 Type 1 diabetes mellitus with other diabetic kidney complication: Secondary | ICD-10-CM

## 2012-09-11 DIAGNOSIS — IMO0002 Reserved for concepts with insufficient information to code with codable children: Secondary | ICD-10-CM

## 2012-09-11 DIAGNOSIS — E1143 Type 2 diabetes mellitus with diabetic autonomic (poly)neuropathy: Secondary | ICD-10-CM

## 2012-09-11 DIAGNOSIS — E11649 Type 2 diabetes mellitus with hypoglycemia without coma: Secondary | ICD-10-CM

## 2012-09-11 DIAGNOSIS — E1149 Type 2 diabetes mellitus with other diabetic neurological complication: Secondary | ICD-10-CM

## 2012-09-11 DIAGNOSIS — E1065 Type 1 diabetes mellitus with hyperglycemia: Secondary | ICD-10-CM

## 2012-09-11 DIAGNOSIS — G909 Disorder of the autonomic nervous system, unspecified: Secondary | ICD-10-CM

## 2012-09-11 DIAGNOSIS — E1169 Type 2 diabetes mellitus with other specified complication: Secondary | ICD-10-CM

## 2012-09-11 DIAGNOSIS — E063 Autoimmune thyroiditis: Secondary | ICD-10-CM

## 2012-09-11 DIAGNOSIS — E049 Nontoxic goiter, unspecified: Secondary | ICD-10-CM

## 2012-09-11 DIAGNOSIS — Z23 Encounter for immunization: Secondary | ICD-10-CM

## 2012-09-11 DIAGNOSIS — R809 Proteinuria, unspecified: Secondary | ICD-10-CM

## 2012-09-11 DIAGNOSIS — E1042 Type 1 diabetes mellitus with diabetic polyneuropathy: Secondary | ICD-10-CM

## 2012-09-11 DIAGNOSIS — E038 Other specified hypothyroidism: Secondary | ICD-10-CM

## 2012-09-11 DIAGNOSIS — I10 Essential (primary) hypertension: Secondary | ICD-10-CM

## 2012-09-11 DIAGNOSIS — R000 Tachycardia, unspecified: Secondary | ICD-10-CM

## 2012-09-11 DIAGNOSIS — E1049 Type 1 diabetes mellitus with other diabetic neurological complication: Secondary | ICD-10-CM

## 2012-09-11 LAB — POCT GLYCOSYLATED HEMOGLOBIN (HGB A1C): Hemoglobin A1C: 10.9

## 2012-09-11 MED ORDER — LISINOPRIL 2.5 MG PO TABS: 2.5000 mg | ORAL_TABLET | Freq: Every day | ORAL | Status: DC

## 2012-09-11 NOTE — Progress Notes (Signed)
Subjective:  Patient Name: [name] Date of Birth: Apr 30, 1991  MRN: [id number]  [name]  presents to the office today for follow-up evaluation and management of her type 1 diabetes mellitus, hypoglycemia, goiter, peripheral neuropathy, autonomic neuropathy, tachycardia, and angiopathy.  HISTORY OF PRESENT ILLNESS:   [name] is a 21 y.o. Caucasian young woman.   [name] was unaccompanied.   1. The patient was first referred to me on 11/11/09 by Dr. Burnell Blanks, of [facility], for evaluation and management of type 1 diabetes mellitus. The patient was 21 years old.   A. Just before her [age] birthday, the patient was diagnosed with new onset type 1 diabetes mellitus. She was admitted to [facility] and then referred to the [facility] pediatric endocrine clinic. She went on a Cozmo pump at about age 40. At about age 72 she had problems with the pump not delivering insulin, causing recurrent DKA, so she stopped pump therapy. At the time of her first visit with me she was taking 17 units of Lantus at night most days, but only 16 units per night during menses. Her insulin carbohydrate ratio for NovoLog was one unit for every 10 g of carbohydrates. Her insulin sensitivity factor for NovoLog was one unit for every 100 points above 150. She often adjusted NovoLog doses to preclude hypoglycemia occurring during activity. Past medical history was positive for IgA nephropathy that was in remission. She had had a renal biopsy at age 41. She also had had incision and drainage of a staphylococcal facial abscess. She underwent menarche at age 45/12. Her cycles were regular. She was on enalapril for renal protection. Family history was positive for type 2 diabetes mellitus in the paternal grandfather and paternal great-grandmother. Maternal grandmother took thyroid medication for hypothyroidism. The MGM had never had any thyroid surgery or radiation to her thyroid. Both the maternal  grandmother and the paternal grandfather had MIs. The father had a stroke at age 74. Paternal grandfather also had a stroke. The father reportedly had hypertension and severe carotid artery disease.  B. On examination, her height was at the 35th percentile and her weight was at the 50th percentile. Her blood pressure was 140/80. Her hemoglobin A1c was 8.9%. She had a 20-25 g goiter. She had trace DP and PT pulses. Sensation to touch was slightly decreased in both heels. Sensation to monofilament was slightly decreased in the right heel. Laboratory test were then performed. CMP was normal. Cholesterol was 178, triglycerides 69, HDL 55, LDL 109. Her TSH was 2.31. Free T4 was 1.2. Free T3 was 2.8. Her microalbumin/creatinine ratio was 123.2 (normal less than 30). I put her on our small graduated snack plan. I asked her to subtract 50-100 points from the next blood sugar when she exercised. 2. During the past three years, her blood sugars have generally been higher and more erratic than I wished. She has also had frequent low blood sugars. On 06/04/10 I changed her NovoLog plan. Her new correction dose was one unit for every 30 points of blood sugar greater than 150. Her food dose ws one unit for every 15 grams of carbs.  She remained on the small bedtime snack plan and on 17 units of Lantus.  3. The patient's last [facility] visit was on 05/03/12. In the interim, she was diagnosed with genital herpes. When she has an outbreak her BGs increase. She did increase her Lantus to 18 units during the outbreak, but after successful treatment with Valtrex, she  reduced the Lantus dose back to 17 units. Her correction dose for Novolog remains 1:50 > 150. She recently changed her ICR from 1:15 to 1:10 grams. She never had the left great toenail evaluated. It was growing out, but then she stubbed her toe and the nail came off. She has not had any problems recently with her tonsils or wisdom teeth, so she has not yet had her planned  tonsillectomy or wisdom teeth removal. She has not resumed lisinopril because she had sensations of uncomfortable floating, got lightheaded at work one day, and passed out. These symptoms resolved after stopping the lisinopril.   4. Pertinent Review of Systems:  Constitutional: The patient feels "pretty good". The patient seems healthy and active. Eyes: Vision seems to be good. There are no recognized eye problems. She had a dilated eye exam in November. She is due for FU exam in November 2014. Neck: The patient has no complaints of anterior neck swelling, soreness, tenderness, pressure, discomfort, or difficulty swallowing.   Heart: Heart rate increases with exercise or other physical activity. The patient has no complaints of palpitations, irregular heart beats, chest pain, or chest pressure.   Gastrointestinal: She is often excessively hungry and queasy. Whenever she has low BG that requires treatment she notes nausea and epigastric pain that can last for several hours after the BG comes up. Bowel movents seem normal. The patient has no complaints of excessive hunger, acid reflux, upset stomach, stomach aches or pains, diarrhea, or constipation.  Legs: Muscle mass and strength seem normal. There are no complaints of numbness, tingling, burning, or pain. No edema is noted.  Feet: As above. There are no complaints of numbness, tingling, burning, or pain. No edema is noted. Neurologic: There are no recognized problems with muscle movement and strength, sensation, or coordination. GYN: LMP is now. This is her first period in one year. She is still using her Implanon implant. The implant is due to be changed out in 2014. Hypoglycemia: She had not had many low BGs since reducing Lantus to 17 units at HS. When she develops low BGs her major symptom is nausea.  5. BG printout: She often misses the bedtime BG check, thus causing a fair amount of variability in the AM BG checks. Since starting to use her new BG  meter, she has been checking almost 3 times daily. She has had several BGs in the 60s recently.   PAST MEDICAL, FAMILY, AND SOCIAL HISTORY  Past Medical History  Diagnosis Date  . Type 1 diabetes mellitus in patient age 25-19 years with HbA1C goal below 7.5   . Hypoglycemia associated with diabetes   . Goiter   . Diabetic peripheral neuropathy associated with type 1 diabetes mellitus   . Type 1 diabetes mellitus with diabetic autonomic neuropathy   . Diabetic peripheral angiopathy   . Tachycardia   . Febrile seizures     Seizures occurred at about 1/-71 months of age.  . Diabetes mellitus type I     Family History  Problem Relation Age of Onset  . Hypertension Father   . Heart disease Maternal Grandmother   . Diabetes Paternal Grandfather     T2 DM  . Heart disease Paternal Grandfather     Current outpatient prescriptions:insulin aspart (NOVOLOG PENFILL) 100 UNIT/ML injection, Inject 25 Units into the skin 4 (four) times daily -  before meals and at bedtime., Disp: 15 mL, Rfl: 6;  insulin glargine (LANTUS) 100 UNIT/ML injection, Inject 17 Units into the  skin at bedtime. Inject 18 units at bedtime or as directed., Disp: , Rfl: ;  ONE TOUCH ULTRA TEST test strip, USE TO CHECK BLOOD SUGAR 6 TO 8 TIMES A DAY, Disp: 1 each, Rfl: 6 Fexofenadine HCl (ALLEGRA PO), Take by mouth.  , Disp: , Rfl: ;  lisinopril (ZESTRIL) 2.5 MG tablet, Take 1 tablet (2.5 mg total) by mouth daily., Disp: 30 tablet, Rfl: 11  Allergies as of 09/11/2012 - Review Complete 09/11/2012  Allergen Reaction Noted  . Sulfa antibiotics Nausea And Vomiting 04/30/2011     reports that she has never smoked. She has never used smokeless tobacco. Pediatric History  Patient Guardian Status  . Mother:  Hill,Melissa   Other Topics Concern  . Not on file   Social History Narrative   Lives with mom, stepdad, sister. Attends [employer].      1. School and Family: She still attends [employer] from 8 AM to 1 PM Monday-Thursday. She  also has a part-time job at a day care center. She is interested in a CMA position.  2. Activities: She is not involved in any regular physical activities.  3. Primary Care Provider: Dr. Sharman Crate Hamrick  ROS: There are no other significant problems involving Paige's other body systems.   Objective:  Vital Signs:  BP 124/88  Pulse 114  Wt 125 lb 4.8 oz (56.836 kg)   Ht Readings from Last 3 Encounters:  10/04/11 5' 3.62" (1.616 m) (40.12%*)   * Growth percentiles are based on CDC 2-20 Years data.   Wt Readings from Last 3 Encounters:  09/11/12 125 lb 4.8 oz (56.836 kg) (45.17%*)  05/03/12 125 lb 3.2 oz (56.79 kg) (46.44%*)  02/03/12 118 lb (53.524 kg) (32.72%*)   * Growth percentiles are based on CDC 2-20 Years data.   PHYSICAL EXAM: Constitutional: The patient appears healthy and well nourished. The patient's height and weight are normal for age. She is bright and alert. She remains hypertensive and tachycardic.  Head: The head is normocephalic. Face: The face appears normal.  Eyes: There is no obvious arcus or proptosis. Moisture appears normal. Mouth: The oropharynx and tongue appear normal. Oral moisture is normal. Neck: The neck appears to be visibly normal. No carotid bruits are noted. The thyroid gland is 25+ grams in size. Both lobes are enlarged. The left lobe is again larger than the right lobe. The isthmus is also enlarged today. The consistency of the thyroid gland is normal . The thyroid gland is not tender to palpation. Lungs: The lungs are clear to auscultation. Air movement is good. Heart: Heart rate and rhythm are regular. Heart sounds S1 and S2 are normal. I did not appreciate any pathologic cardiac murmurs. Abdomen: The abdomen appears to be normal in size for the patient's age. Bowel sounds are normal. There is no obvious hepatomegaly, splenomegaly, or other mass effect.  Arms: Muscle size and bulk are normal for age. Hands: There is no obvious tremor. Phalangeal  and metacarpophalangeal joints are normal. Palmar muscles are normal for age. Palmar skin is normal. Palmar moisture is also normal. Legs: Muscles appear normal for age. No edema is present. Feet: Feet are normally formed. She has the stump of the left great toe nail in place. The medial aspect of the stump is somewhat raised. The nail bed is not infected. Dorsalis pedal pulses are very faint 1+ on the right and faint 1+ on the left. Neurologic: Strength is normal for age in both the upper and lower extremities. Muscle  tone is normal. Sensation to touch is normal in both legs, but slightly decreased in the heels.    LAB DATA: Hemoglobin A1c today was 10.9%, compared with 11.8% at last visit.         Lab data 04/12/12: Cholesterol 202, triglycerides 105, HDL 63, LDL 118. CMP normal. TSH 1.993, free T4 1.2, free T3 3.4, TPO antibody < 10. Urinary microalbumin/creatitnine ratio 97.9.   Assessment and Plan:   ASSESSMENT:  1. Type 1 diabetes mellitus: Hemoglobin A1c is better, but is still elevated. She really needs to check BGs 4 times daily or more and take appropriate correction doses and food doses.  She frequently misses the bedtime BG check, causing lots of variability in AM BGs. 2. Hypoglycemia: This occurred twice in 3 weeks.   3. Goiter: The thyroid is a bit larger today. The waxing and waning of the thyroid gland size is c/w evolving Hashimoto's disease.  4. Hashimoto's disease: Her thyroiditis is clinically quiescent. 5. Peripheral neuropathy: This is mild. 6.  Autonomic neuropathy and tachycardia: These problems are essentially unchanged. Both forms of neuropathy will correct if she can keep the BGs in mostly a near-normal range. 7. Hypothyroidism: Her TFTs in May were mid-range normal. Since we know she has Hashimoto's disease, we know that she will become hypothyroid eventually. 8. Microalbuminuria: Her microalbumin/creatinine ratio in may was the highest it had been in the years we've  been following it. It is possible that this degree of proteinuria is due to her previous IgA nephropathy rather than to diabetes alone. Treatment with lisinopril should be re-instituted. She also needs to exercise about an hour per day.  9. Hypertension: Patient's diastolic blood pressure was again elevated today.   PLAN:  1. Diagnostic: No labs today. 2. Therapeutic: Check BGs at meals and at bedtime. Call in one week on Wednesday or Sunday evening to discuss  BGs and to adjust insulin plan. Continue Lantus at 17 units. 3. Patient education: I talked with her again about converting to an insulin pump. She is somewhat interested in the new LGD pump.  4. Follow-up: 4 months   Level of Service: This visit lasted in excess of 90 minutes. More than 50% of the visit was devoted to counseling.  David Stall, MD

## 2012-09-11 NOTE — Patient Instructions (Signed)
Follow up visit in 3 months. Call Dr. Fransico Cyriah Childrey on Wednesday or Sunday evening after 09/17/12, between 8-10 PM. .

## 2012-11-24 ENCOUNTER — Other Ambulatory Visit: Payer: Self-pay | Admitting: "Endocrinology

## 2013-01-09 ENCOUNTER — Ambulatory Visit: Payer: BC Managed Care – PPO | Admitting: "Endocrinology

## 2013-03-07 ENCOUNTER — Other Ambulatory Visit: Payer: Self-pay | Admitting: "Endocrinology

## 2013-03-07 DIAGNOSIS — E1065 Type 1 diabetes mellitus with hyperglycemia: Secondary | ICD-10-CM

## 2013-03-14 ENCOUNTER — Encounter: Payer: Self-pay | Admitting: "Endocrinology

## 2013-03-14 ENCOUNTER — Ambulatory Visit (INDEPENDENT_AMBULATORY_CARE_PROVIDER_SITE_OTHER): Payer: 59 | Admitting: "Endocrinology

## 2013-03-14 ENCOUNTER — Other Ambulatory Visit: Payer: Self-pay | Admitting: *Deleted

## 2013-03-14 VITALS — BP 123/81 | HR 127 | Ht 63.54 in | Wt 130.0 lb

## 2013-03-14 DIAGNOSIS — E063 Autoimmune thyroiditis: Secondary | ICD-10-CM

## 2013-03-14 DIAGNOSIS — E11649 Type 2 diabetes mellitus with hypoglycemia without coma: Secondary | ICD-10-CM

## 2013-03-14 DIAGNOSIS — E1065 Type 1 diabetes mellitus with hyperglycemia: Secondary | ICD-10-CM

## 2013-03-14 DIAGNOSIS — E1143 Type 2 diabetes mellitus with diabetic autonomic (poly)neuropathy: Secondary | ICD-10-CM

## 2013-03-14 DIAGNOSIS — I499 Cardiac arrhythmia, unspecified: Secondary | ICD-10-CM

## 2013-03-14 DIAGNOSIS — G909 Disorder of the autonomic nervous system, unspecified: Secondary | ICD-10-CM

## 2013-03-14 DIAGNOSIS — E049 Nontoxic goiter, unspecified: Secondary | ICD-10-CM

## 2013-03-14 DIAGNOSIS — E1169 Type 2 diabetes mellitus with other specified complication: Secondary | ICD-10-CM

## 2013-03-14 DIAGNOSIS — R809 Proteinuria, unspecified: Secondary | ICD-10-CM

## 2013-03-14 DIAGNOSIS — I10 Essential (primary) hypertension: Secondary | ICD-10-CM

## 2013-03-14 DIAGNOSIS — E1149 Type 2 diabetes mellitus with other diabetic neurological complication: Secondary | ICD-10-CM

## 2013-03-14 LAB — GLUCOSE, POCT (MANUAL RESULT ENTRY): POC Glucose: 119 mg/dl — AB (ref 70–99)

## 2013-03-14 LAB — POCT GLYCOSYLATED HEMOGLOBIN (HGB A1C): Hemoglobin A1C: 12.8

## 2013-03-14 MED ORDER — GLUCOSE BLOOD VI STRP: ORAL_STRIP | Status: AC

## 2013-03-14 NOTE — Progress Notes (Signed)
Subjective:  Patient Name: [name] Date of Birth: [birth year]  MRN: [id number]  [name]  presents to the office today for follow-up evaluation and management of her type 1 diabetes mellitus, hypoglycemia, goiter, peripheral neuropathy, autonomic neuropathy, tachycardia, angiopathy, and noncompliance.  HISTORY OF PRESENT ILLNESS:   [name] is a 22 y.o. Caucasian young woman.   [name] was unaccompanied.   1. The patient was first referred to me on 11/11/09 by Dr. Burnell Blanks, of [facility], for evaluation and management of type 1 diabetes mellitus. The patient was 22 years old.   A. Just before her [age] birthday, the patient was diagnosed with new onset type 1 diabetes mellitus. She was admitted to [facility] and then referred to the [facility] pediatric endocrine clinic. She went on a Cozmo pump at about age 61. At about age 24 she had problems with the pump not delivering insulin, causing recurrent DKA, so she stopped pump therapy. At the time of her first visit with me she was taking 17 units of Lantus at night most days, but only 16 units per night during menses. Her insulin carbohydrate ratio for Novolog was one unit for every 10 g of carbohydrates. Her insulin sensitivity factor for Novolog was one unit for every 100 points above 150. She often adjusted Novolog doses to preclude hypoglycemia occurring during activity. Past medical history was positive for IgA nephropathy that was in remission. She had had a renal biopsy at age 72. She also had had incision and drainage of a staphylococcal facial abscess. She underwent menarche at age 90-12. Her cycles were regular. She was on enalapril for renal protection. Family history was positive for type 2 diabetes mellitus in the paternal grandfather and paternal great-grandmother. Maternal grandmother took thyroid medication for hypothyroidism. The MGM had never had any thyroid surgery or radiation to her thyroid. Both the  maternal grandmother and the paternal grandfather had MIs. The father had a stroke at age 26. Paternal grandfather also had a stroke. The father reportedly had hypertension and severe carotid artery disease.  B. On examination, her height was at the 35th percentile and her weight was at the 50th percentile. Her blood pressure was 140/80. Her hemoglobin A1c was 8.9%. She had a 20-25 g goiter. She had trace DP and PT pulses. Sensation to touch was slightly decreased in both heels. Sensation to monofilament was slightly decreased in the right heel. Laboratory tests were then performed. CMP was normal. Cholesterol was 178, triglycerides 69, HDL 55, LDL 109. Her TSH was 2.31. Free T4 was 1.2. Free T3 was 2.8. Her microalbumin/creatinine ratio was 123.2 (normal less than 30). I put her on our small graduated bedtime snack plan. I asked her to subtract 50-100 points from the next blood sugar reading after she exercised. 2. During the past three years, her blood sugars have generally been higher and more erratic than I wished. She has also had frequent low blood sugars. On 06/04/10 I changed her Novolog plan. Her new correction dose was one unit for every 30 points of blood sugar greater than 150. Her food dose was one unit for every 15 grams of carbs.  She remained on the small bedtime snack plan and on 17 units of Lantus.  3. The patient's last [facility] visit was on 09/11/12. In the interim, she had many problems involved with her former boy friend and missed her FU appointment. She still takes her Lantus dose of 18 units. Her correction dose for Novolog  is 1 unit for every 50 points of BG > 150. She recently changed her ICR from 1:10 to 1:5 grams. She has been healthy, except for one recurrence of HSV1 herpes. She has not resumed lisinopril because she had sensations of uncomfortable floating, got lightheaded at work one day, and passed out. These symptoms resolved after stopping the lisinopril. In the 2-3 months prior to  this visit she was "concerned about other issues, was not checking her BGs often, and was often missing insulin doses". She has done better in the past month.   4. Pertinent Review of Systems:  Constitutional: The patient feels "good, but stressed.". The patient seems healthy and active. Eyes: Vision seems to be good. There are no recognized eye problems. She had a dilated eye exam in November. She is due for FU exam in November 2014. Neck: The patient has no complaints of anterior neck swelling, soreness, tenderness, pressure, discomfort, or difficulty swallowing.  Heart: Heart rate increases with exercise or other physical activity. The patient has no complaints of palpitations, irregular heart beats, chest pain, or chest pressure.   Gastrointestinal: She has not been as excessively hungry and queasy since cutting back on her soft drinks.  Whenever she has low BG that requires treatment she notes nausea and epigastric pain that can last for several hours after the BG comes up. Bowel movents seem normal. The patient has no complaints of excessive hunger, acid reflux, upset stomach, stomach aches or pains, diarrhea, or constipation.  Legs: Muscle mass and strength seem normal. There are no complaints of numbness, tingling, burning, or pain. No edema is noted.  Feet: As above. There are no complaints of numbness, tingling, burning, or pain. No edema is noted. Neurologic: There are no recognized problems with muscle movement and strength, sensation, or coordination. GYN: LMP was March 11th. Her Implanon implant was removed last November. She has had regular periods since then. She is not on any form of birth control at present.  Hypoglycemia: She said that she had not had many low BGs, but her printout showed several.  5. 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 She often does not eat dinner until 9-11 PM. She often misses the bedtime BG  check, thus causing a fair amount of variability in the AM BG checks. She has been checking BGs almost 3 times daily. She has had more frequent hypoglycemia recently, with several BGs in the 50s and 60s, usually after going to the gym, at midnight, mornings, or at lunch if she sleeps in late.  Her mean BG in the past 4 weeks was 149.8, but this value also includes 5 BGs < 70.  PAST MEDICAL, FAMILY, AND SOCIAL HISTORY  Past Medical History  Diagnosis Date  . Type 1 diabetes mellitus in patient age 22-19 years with HbA1C goal below 7.5   . Hypoglycemia associated with diabetes   . Goiter   . Diabetic peripheral neuropathy associated with type 1 diabetes mellitus   . Type 1 diabetes mellitus with diabetic autonomic neuropathy   . Diabetic peripheral angiopathy   . Tachycardia   . Febrile seizures     Seizures occurred at about 74/-62 months of age.  . Diabetes mellitus type I     Family History  Problem Relation Age of Onset  . Hypertension Father   . Heart disease Maternal Grandmother   . Diabetes Paternal Grandfather     T2  DM  . Heart disease Paternal Grandfather     Current outpatient prescriptions:Fexofenadine HCl (ALLEGRA PO), Take by mouth.  , Disp: , Rfl: ;  insulin glargine (LANTUS) 100 UNIT/ML injection, Inject 17 Units into the skin at bedtime. Inject 18 units at bedtime or as directed., Disp: , Rfl: ;  LANTUS SOLOSTAR 100 UNIT/ML injection, INJECT 18 UNITS UNDER THE SKIN AS DIRECTED BY MD, Disp: 15 mL, Rfl: 6 NOVOLOG PENFILL 100 UNIT/ML injection, INJECT 25 UNITS INTO THE SKIN 4 (FOUR) TIMES DAILY - BEFORE MEALS AND AT BEDTIME., Disp: 15 mL, Rfl: 6;  ONE TOUCH ULTRA TEST test strip, USE TO CHECK BLOOD SUGAR 6 TO 8 TIMES A DAY, Disp: 1 each, Rfl: 6;  insulin aspart (NOVOLOG PENFILL) 100 UNIT/ML injection, Inject 25 Units into the skin 4 (four) times daily -  before meals and at bedtime., Disp: 15 mL, Rfl: 6 lisinopril (ZESTRIL) 2.5 MG tablet, Take 1 tablet (2.5 mg total) by mouth  daily., Disp: 30 tablet, Rfl: 11;  [DISCONTINUED] lisinopril (ZESTRIL) 2.5 MG tablet, Take 1 tablet (2.5 mg total) by mouth daily., Disp: 30 tablet, Rfl: 11  Allergies as of 03/14/2013 - Review Complete 03/14/2013  Allergen Reaction Noted  . Sulfa antibiotics Nausea And Vomiting 04/30/2011     reports that she has never smoked. She has never used smokeless tobacco. Pediatric History  Patient Guardian Status  . Mother:  Hill,Melissa   Other Topics Concern  . Not on file   Social History Narrative   Lives with mom, stepdad, sister. Attends [employer].      1. School and Family: She still attends [employer] from 8 AM to 1 PM Monday-Thursday. She will graduate in June with an associate's degree in medical assisting and phlebotomy. She also has a part-time job at a day care center. She is interested in a phlebotomist position.  2. Activities: She goes to the gym about two evenings per week.   3. Primary Care Provider: Dr. Burnell Blanks  REVIEW OF SYSTEMS: There are no other significant problems involving Paige's other body systems.   Objective:  Vital Signs:  BP 123/81  Pulse 127  Ht 5' 3.54" (1.614 m)  Wt 130 lb (58.968 kg)  BMI 22.64 kg/m2   Ht Readings from Last 3 Encounters:  03/14/13 5' 3.54" (1.614 m)  10/04/11 5' 3.62" (1.616 m) (40%*, Z = -0.25)   * Growth percentiles are based on CDC 2-20 Years data.   Wt Readings from Last 3 Encounters:  03/14/13 130 lb (58.968 kg)  09/11/12 125 lb 4.8 oz (56.836 kg) (45%*, Z = -0.12)  05/03/12 125 lb 3.2 oz (56.79 kg) (46%*, Z = -0.09)   * Growth percentiles are based on CDC 2-20 Years data.   PHYSICAL EXAM: Constitutional: The patient appears healthy and well nourished. The patient's height and weight are normal for age. She is bright and alert. She remains hypertensive. Her tachycardia is worse.  Head: The head is normocephalic. Face: The face appears normal.  Eyes: There is no obvious arcus or proptosis. Moisture appears  normal. Mouth: The oropharynx and tongue appear normal. Oral moisture is normal. Neck: The neck appears to be visibly normal. No carotid bruits are noted. The thyroid gland is 20+ grams in size. Both lobes are mildly enlarged. The consistency of the thyroid gland is normal . The thyroid gland is not tender to palpation. Lungs: The lungs are clear to auscultation. Air movement is good. Heart: Heart rate and rhythm are regular. Heart sounds  S1 and S2 are normal. I did not appreciate any pathologic cardiac murmurs. Abdomen: The abdomen appears to be normal in size for the patient's age. Bowel sounds are normal. There is no obvious hepatomegaly, splenomegaly, or other mass effect.  Arms: Muscle size and bulk are normal for age. Hands: She is trembling due to having a hypoglycemic reaction with a BG of 62.  Phalangeal and metacarpophalangeal joints are normal. Palmar muscles are normal for age. Palmar skin is normal. Palmar moisture is also normal. Legs: Muscles appear normal for age. No edema is present. Feet: Feet are normally formed. She has the stump of the left great toe nail in place. The nail bed is not infected. Dorsalis pedal pulses are very faint 1+ on the right and faint 1+ on the left. Neurologic: Strength is normal for age in both the upper and lower extremities. Muscle tone is normal. Sensation to touch is normal in both legs and both feet.    LAB DATA: Hemoglobin A1c today was 12.8%, compared with 10.9% at last visit and with 11.8% at the prior visit. BG dropped from 119 to 62 during today's visit. A 15 gm juice box brought the BG up to 77. She then took an additional 16 gms of glucose tabs.          Lab data 04/12/12: Cholesterol 202, triglycerides 105, HDL 63, LDL 118. CMP normal. TSH 1.993, free T4 1.2, free T3 3.4, TPO antibody < 10. Urinary microalbumin/creatinine ratio 97.9.   Assessment and Plan:   ASSESSMENT:  1. Type 1 diabetes mellitus: Hemoglobin A1c is much worse, c/w her  admitting that she was not doing a good job of taking care of her T1DM 1-3 months ago. Her most recent monthly BG meter download show that she has been doing much better recently. She really needs to check BGs 4 times daily or more and take appropriate correction doses and food doses.   2. Hypoglycemia: Since resuming exercise she has had more frequent low BGs, either at the end of exercise or several hours later. She has not been subtracting 50-100 points of BG from the BG value post-exercise.   3. Goiter: The thyroid is smaller today. The waxing and waning of the thyroid gland size is c/w evolving Hashimoto's disease.  4. Hashimoto's disease: Her thyroiditis is clinically quiescent. 5. Peripheral neuropathy: This problem is not evident today. 6.  Autonomic neuropathy and tachycardia: These problems are worse, c/w her higher HbA1c. This form of neuropathy will reverse if she can keep the BGs in a near-normal range most of the time. 7. Hypothyroidism, intermittent: [name] has intermittently had TFTs in the hypothyroid range following an episode of Hashimoto's thyroiditis. Her TFTs in May were mid-range normal. Since we know she has Hashimoto's disease, we know that she will probably become permanently hypothyroid eventually. 8. Microalbuminuria: Her microalbumin/creatinine ratio in May was the highest it had been in the years we've been following it. It is possible that this degree of proteinuria is due to her previous IgA nephropathy rather than to diabetes alone. Treatment with lisinopril should be re-instituted, but she is afraid to do so. She may do well with a dose of 1.25 mg/day. She also needs to exercise about an hour per day.  9. Hypertension: Patient's diastolic blood pressure was again elevated today. She is willing to resume half-doses of lisinopril. 10. Non-compliance: She is smart enough and has enough insight to take really good care of her DM. Unfortunately, her life issues often  get in the  way. Although she knows intellectually that high BGs can cause chronic complications and hurt her eventually, none of that affects her now or is real to her now.   PLAN:  1. Diagnostic: Annual surveillance labs prior to next visit. If she is even the slightest bit concerned about hypoglycemia after exercise, she should check her BG before getting into her car. Call in 2 weeks on a Wednesday or Sunday evening to discuss BGs.  2. Therapeutic: Check BGs at meals and at bedtime. Continue Lantus at 18 units. Continue her current Novolog doses. Subtract 50-100 points of BG after exercise. Please resume lisinopril at 1/2 of a 2.5 mg pill each day.  3. Patient education: I talked with her again about converting to an insulin pump. She is firmly and absolutely not interested in a  pump.  4. Follow-up: 3 months   Level of Service: This visit lasted in excess of 50 minutes. More than 50% of the visit was devoted to counseling.  David Stall, MD

## 2013-03-14 NOTE — Patient Instructions (Signed)
Follow up visit in 3 months. Please call in 2 weeks on a Wednesday or Sunday evening between 8-10 to discuss BGs. Please resume lisinopril at 1/2 of a 2.5 mg pill each day.

## 2013-05-25 ENCOUNTER — Other Ambulatory Visit: Payer: Self-pay | Admitting: *Deleted

## 2013-05-25 DIAGNOSIS — E1065 Type 1 diabetes mellitus with hyperglycemia: Secondary | ICD-10-CM

## 2013-06-28 ENCOUNTER — Ambulatory Visit: Payer: 59 | Admitting: "Endocrinology

## 2013-07-11 ENCOUNTER — Encounter: Payer: Self-pay | Admitting: *Deleted

## 2013-07-11 ENCOUNTER — Encounter: Payer: 59 | Attending: Internal Medicine | Admitting: *Deleted

## 2013-07-11 VITALS — Ht 61.75 in | Wt 129.9 lb

## 2013-07-11 DIAGNOSIS — E1043 Type 1 diabetes mellitus with diabetic autonomic (poly)neuropathy: Secondary | ICD-10-CM

## 2013-07-11 DIAGNOSIS — Z713 Dietary counseling and surveillance: Secondary | ICD-10-CM | POA: Insufficient documentation

## 2013-07-11 DIAGNOSIS — E109 Type 1 diabetes mellitus without complications: Secondary | ICD-10-CM

## 2013-07-11 DIAGNOSIS — E1065 Type 1 diabetes mellitus with hyperglycemia: Secondary | ICD-10-CM

## 2013-07-11 DIAGNOSIS — E119 Type 2 diabetes mellitus without complications: Secondary | ICD-10-CM | POA: Insufficient documentation

## 2013-07-11 NOTE — Progress Notes (Signed)
Appt start time: 1500 end time:  1600.  Assessment:  Patient was seen on  07/11/2013 for individual diabetes education. She was diagnosed with DM 1 at age 22, 16 years ago and is currently [redacted] weeks pregnant.  She is living with parents now, Steffanie Rainwater is in [employer] stationed in [place]. Heide Spark is planned for August 23, in 2 weeks! Mom shops for food, they both are preparing the meals. SMBG 8-10 times a day, reported range now between 80-100 with excursions down to 35 and up to less than 200 mg/dl. 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 Enjoys shopping and occasionally has taken Zumba classes twice a week. Interested in CGM but now a pump at this time.  Current HbA1c: stated  10.0 end of June, 2014  MEDICATIONS: see list   DIETARY INTAKE:  Usual eating pattern includes 3 meals and 1-2 snacks per day.  Everyday foods include good variety of all food groups.  Avoided foods include high fat, high sugar foods.    24-hr recall:  B ( AM): nauseated so eating whatever she can tolerated- saltine crackers, a PNB poptart if BG is low, skim milk  Snk ( AM): none  L ( PM): brings from home more; Fit and Active frozen dinner- cheese lasagna, SF lemonade or water Snk ( PM): occasionally animal or PNB crackers D ( PM): varies - meat, starch, vegetables, salad with [place] dressing, Diet Sprite, SF lemonade or water Snk ( PM): not usually unless BG is too low Beverages: Diet Sprite, SF lemonade or water   Usual physical activity: not anything structured other than child care with work  Progress Towards Goal(s):  In progress.   Nutritional Diagnosis:  NB-1.1 Food and nutrition-related knowledge deficit As related to DM1 and pregnancy.  As evidenced by A1c of 10%.    Intervention:  Nutrition counseling provided.  Discussed diabetes disease process and treatment options.  Discussed physiology of diabetes and role of  obesity on insulin resistance.  Encouraged moderate weight reduction to improve glucose levels.  Discussed role of medications and diet in glucose control  Provided education on macronutrients on glucose levels.  Provided education on carb counting, importance of regularly scheduled meals/snacks, and meal planning  Discussed effects of physical activity on glucose levels and long-term glucose control.  Recommended 150 minutes of physical activity/week.  Reviewed patient medications.  Discussed role of medication on blood glucose and possible side effects  Discussed blood glucose monitoring and interpretation.  Discussed recommended target ranges and individual ranges.    Described short-term complications: hyper- and hypo-glycemia.  Discussed causes,symptoms, and treatment options.  Discussed prevention, detection, and treatment of long-term complications.  Discussed the role of prolonged elevated glucose levels on body systems.  Discussed role of stress on blood glucose levels and discussed strategies to manage psychosocial issues.  Discussed recommendations for long-term diabetes self-care.  Established checklist for medical, dental, and emotional self-care.  Handouts given during visit include:  Dexcom information   Snack ideas including protein choices  Carb Counting by food group handout  Carnation Essentials sample packets  Barriers to learning/adherance to lifestyle change: nausea with early pregnancy  Diabetes self-care support Plan:  Consider use of Carnation Essential powder or Glucerna as a meal replacement or supplement when not able to eat normally You will be contacted by the Dexcom Rep to provide you with pricing and coverage for  Dexcom G4 CGM device Consider using Calorie Brooke Dare APP on your phone for nutrition info and Carb Counting. Consider including protein with meals and snacks to soften BG excursions and for your increased protein needs with  pregnancy  Monitoring/Evaluation:  Dietary intake, exercise, SMBG, and body weight prn.

## 2013-07-11 NOTE — Patient Instructions (Signed)
Plan:  Consider use of Carnation Essential powder or Glucerna as a meal replacement or supplement when not able to eat normally You will be contacted by the Dexcom Rep to provide you with pricing and coverage for Dexcom G4 CGM device Consider using Calorie Brooke Dare APP on your phone for nutrition info and Carb Counting. Consider including protein with meals and snacks to soften BG excursions and for your increased protein needs with pregnancy

## 2013-08-02 LAB — OB RESULTS CONSOLE GC/CHLAMYDIA
Chlamydia: NEGATIVE
Gonorrhea: NEGATIVE

## 2013-08-08 LAB — OB RESULTS CONSOLE ANTIBODY SCREEN: Antibody Screen: NEGATIVE

## 2013-08-08 LAB — OB RESULTS CONSOLE RPR: RPR: NONREACTIVE

## 2013-08-08 LAB — OB RESULTS CONSOLE HEPATITIS B SURFACE ANTIGEN: Hepatitis B Surface Ag: NEGATIVE

## 2013-08-08 LAB — OB RESULTS CONSOLE RUBELLA ANTIBODY, IGM: Rubella: IMMUNE

## 2013-08-08 LAB — OB RESULTS CONSOLE ABO/RH: RH Type: NEGATIVE

## 2013-08-08 LAB — OB RESULTS CONSOLE HIV ANTIBODY (ROUTINE TESTING): HIV: NONREACTIVE

## 2013-09-21 ENCOUNTER — Other Ambulatory Visit: Payer: Self-pay

## 2013-09-24 ENCOUNTER — Other Ambulatory Visit: Payer: Self-pay

## 2013-09-24 ENCOUNTER — Ambulatory Visit (HOSPITAL_COMMUNITY)
Admission: RE | Admit: 2013-09-24 | Discharge: 2013-09-24 | Disposition: A | Discharge status: Home / Self Care | Payer: 59 | Source: Ambulatory Visit | Attending: Obstetrics and Gynecology | Admitting: Obstetrics and Gynecology

## 2013-09-24 ENCOUNTER — Other Ambulatory Visit (HOSPITAL_COMMUNITY): Payer: Self-pay | Admitting: Obstetrics and Gynecology

## 2013-09-24 ENCOUNTER — Ambulatory Visit (HOSPITAL_COMMUNITY)
Admission: RE | Admit: 2013-09-24 | Discharge: 2013-09-24 | Disposition: A | Discharge status: Home / Self Care | Payer: 59 | Source: Ambulatory Visit

## 2013-09-24 ENCOUNTER — Ambulatory Visit (HOSPITAL_COMMUNITY): Admission: RE | Admit: 2013-09-24 | Payer: 59 | Source: Ambulatory Visit

## 2013-09-24 VITALS — BP 119/89 | HR 104 | Wt 129.0 lb

## 2013-09-24 DIAGNOSIS — IMO0002 Reserved for concepts with insufficient information to code with codable children: Secondary | ICD-10-CM

## 2013-09-24 DIAGNOSIS — O24919 Unspecified diabetes mellitus in pregnancy, unspecified trimester: Secondary | ICD-10-CM | POA: Insufficient documentation

## 2013-09-24 DIAGNOSIS — O358XX0 Maternal care for other (suspected) fetal abnormality and damage, not applicable or unspecified: Secondary | ICD-10-CM | POA: Insufficient documentation

## 2013-09-24 DIAGNOSIS — E1065 Type 1 diabetes mellitus with hyperglycemia: Secondary | ICD-10-CM

## 2013-09-24 NOTE — Progress Notes (Signed)
[name]  was seen today for an ultrasound appointment.  See full report in [facility].  Comments: [name] was referred due to to suspected fetal anomalies.  Her prenatal course has been complicated by pregestational diabetes.  Her initial HbA1C was 10.9 %.  In late September, it had improved to 8.3%.    Ultrasound today reveals multiple anomalies.  Abnormal hand posturing is noted with both upper extremities (the hands appear markedly flexed).  A right clubbed foot is noted.  Mesocardia is appreciaed with an abnormal heart axis directed to the center of the chest.  The RVOT appears very prominent, but does not appear to be overiding the ventricles.  The left outflow tract is not well-visualized.  The stomach was not definitively visualized.  A single umbilical artery is noted.  The septum cavum pellucidum was not visualized - the remainder of the cranial anatomy appeared normal.  The fetal profile appears somewhat "flattened".  There is not evidence of cleft lip.  There is a one week growth lag based on early ultrasound.  The findings and limitations of the study were discussed with the patient.  With the constellation of fetal anomalies and in particular hand abnormalities, the risk of fetal aneuploidy (particularly Trisomy 18) is high. Diabetic embryopathy is less likely, but definitely in the differential diagnosis.  See separate note from Runner, broadcasting/film/video.  After counseling, the patient declined amniocentesis, but elected to undergo NIPS (cell free fetal DNA).  The patient and family are undecided how they wish to proceed in the event that a lethal anomaly is noted.  Will await results of testing for further counseling.  Impression: Single IUP at 17 4/7 weeks Multiple fetal anomalies noted - Abnormal hand posturing, left clubbed foot, absent vs. small stomach bubble, single umbilical artery Mesocardia, promient RVOT.  LVOT not well-visualized Septum cavum pelucidum not seen Abnormal  fetal profile - appears "flattened" Normal amniotic fluid volume  Recommendations: Will expedite fetal echo - will be seen later this week. Cell free fetal DNA Follow up in 3-4 weeks to reevaluate fetal anatomy if the patient elects to continue the pregnancy.  Alpha Gula, MD

## 2013-09-25 ENCOUNTER — Encounter (HOSPITAL_COMMUNITY): Payer: Self-pay

## 2013-09-25 NOTE — Addendum Note (Signed)
Encounter addended by: Alessandra Bevels. Chase Picket, RN on: 09/25/2013  4:52 PM<BR>     Documentation filed: Chief Complaint Section, Episodes

## 2013-09-26 ENCOUNTER — Encounter: Payer: Self-pay | Admitting: Obstetrics and Gynecology

## 2013-09-28 NOTE — Progress Notes (Signed)
Genetic Counseling  High-Risk Gestation Note  Appointment Date:  09/24/2013 Referred By: Jennifer Pila, MD Date of Birth:  1991-05-30    Pregnancy History: G1P0 Estimated Date of Delivery: 02/13/14 Estimated Gestational Age: [redacted]w[redacted]d Attending: Alpha Gula, MD  I met with [name] for genetic counseling because of abnormal ultrasound findings. The patient was accompanied by her parents and her sister to today's visit.   We began by reviewing the ultrasound in detail. Ultrasound performed at the time of today's visit visualized the following: abnormal hand posturing, left clubbed foot, absent vs. small stomach bubble, single umbilical artery, and abnormal fetal profile-described as "flattened" appearance. Additionally, mesocardia, with the heart axis directed to the center of the chest was visualized. Septum cavum pellucidum was not seen during today's ultrasound.  Complete ultrasound results reported separately. [name] was previously scheduled for fetal echocardiogram with Dr. Bobbye Mckinney, [facility]. The fetal echocardiogram appointment was moved up to 09/26/13.   We discussed that the second trimester genetic sonogram is targeted at identifying features associated with aneuploidy. It has evolved as a screening tool used to provide an individualized risk assessment for Down syndrome and other trisomies. The ability of sonography to aid in the detection of aneuploidies relies on identification of both major structural anomalies and "soft markers." The patient was counseled that the latter term refers to findings that are often normal variants and do not cause any significant medical problems. Nonetheless, these markers have a known association with aneuploidy.   The patient and her family were counseled that each of these findings independently are associated with an increased risk for aneuploidy and that the combination of findings is associated with  a significant increase in risk for fetal aneuploidy. Medical records indicated that [name] had previously declined genetic screening in the pregnancy. We reviewed chromosomes, nondisjunction, and the common features and variable prognosis of Down syndrome (Trisomy 21), but specifically discussed the variable features and poor prognoses of conditions such as trisomy 82 and trisomy 40.    We reviewed other available screening and diagnostic options including noninvasive prenatal screening (NIPS)/cell free DNA (cfDNA) testing, and amniocentesis. She was counseled regarding the risks, benefits, and limitations of each option. We reviewed the methodologies of these testing methods and turn around time of each. We specifically discussed the conditions for which NIPS screens, the detection rates and false positives rates. She understands that NIPS does not diagnose or rule out these conditions, nor does it screen for all chromosome or genetic conditions. We reviewed the approximate 1 in 300-500 risk for complications for amniocentesis, including spontaneous pregnancy loss. After consideration of all the options, she elected to proceed with NIPS (Panorama) at the time of today's visit. This result will be available in approximately 8-10 business days. The patient declined amniocentesis at this time but stated that she would possibly consider amniocentesis pending the results of NIPS.   We then discussed other possible explanations for the above discussed ultrasound findings including smaller chromosome aberrations, such as microdeletions or microduplications. We discussed that in addition to karyotype analysis, chromosomal microarray analysis would be available via amniocentesis. A chromosomal microarray is a DNA analysis that looks for changes in copy number of genetic material. A control sample of DNA is used for comparison at thousands of loci across the genome. Microarray analysis allows for the detection of  genetic deletions and duplications that are 100 times smaller than those identified by routine chromosome analysis.  Microarray analysis is  able to detect deletions or duplications of the tested regions, thus it would identify aneuploidy. It would, however, not identify a structural rearrangement or smaller deletions, duplications or point mutations. Thus, while microarray is designed to identify a large number of conditions, it can not rule out all type of mental retardation, birth defects or other genetic conditions. The patient understands that chromosome analysis can also be performed postnatally. We also discussed the possibility of an underlying single gene condition as the etiology for the features on ultrasound. Single gene conditions are typically tested for postnatally, based on the recommendation of a medical geneticist, unless ultrasound findings or the family history are strongly suggestive of a specific syndrome.   We discussed that ultrasound differences can also result from teratogenic exposures and that maternal diabetes mellitus is associated with an increased risk for birth defects. [name] denied the use of illicit substances or alcohol during this pregnancy. [name] has a history of insulin dependent diabetes mellitus. The patient's OB medical records indicate that she had a previous hemoglobin A1C of 10.9 on 06/13/13 and a subsequent hemoglobin A1C of 8.3 on 08/10/2013. The patient reported that she is taking insulin for treatment of her diabetes during pregnancy. We discussed that the patient's underlying diabetes mellitus could be the underlying etiology for the ultrasound findings. However, given the constellation of findings, fetal aneuploidy or genetic condition is more likely suspected. [name] was counseled that the prognosis and postnatal management depend on the underlying etiology of the fetal differences. She understands that the legal limit for termination of pregnancy is [place] is  [redacted] weeks gestation. At the time of today's visit, the patient did not indicate that she was considering TOP for the pregnancy. Follow-up ultrasound was planned for 10/15/13.  Additional follow-up may be determined based on results of NIPS.   Both family histories were reviewed and found to be contributory for a female maternal first cousin to the patient with hearing impairment. She wears hearing aids. She was not described to have additional medical concerns, and an underlying cause for her hearing impairment is not known. Hearing loss can have many causes including genetic factors, environmental factors or a combination of both.  Sometimes hearing loss can occur as one feature of an underlying genetic condition or may be caused by a single nonworking gene. We discussed that given the degree of relation and absence of consanguinity for the patient and her partner, recurrence risk for the current pregnancy would not likely be significantly increased above the general population risk based on the reported family history. However, additional information regarding a cause for their hearing loss is needed in order to most accurately assess the chance for the current pregnancy. Without further information regarding the provided family history, an accurate genetic risk cannot be calculated. Further genetic counseling is warranted if more information is obtained.  We briefly reviewed the results of [name]'s cystic fibrosis carrier screening performed through her OB office (through [employer]) as part of routine carrier screening. 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 CF carrier screening was reportedly performed for the father of the pregnancy last Friday through the patient's OB office. Results are currently pending.  Cystic fibrosis (CF) is a common genetic condition in the Caucasian population occurring in approximately 1 in 3,300 Caucasian births.  This means  approximately 1 in 29 Caucasians is a CF carrier.  Currently over 1,000 mutations have been identified in the CFTR gene; about  70% of Caucasian CF carriers have delta F508.   We spent time reviewing the autosomal recessive inheritance of cystic fibrosis and the 25% chance, when both parents are carriers, to have a child with CF.  We discussed that there are thought to be thousands of mutations which can cause the CF gene to not function properly. CF results in thickened secretions in the lungs, digestive and reproductive systems.  This life-limiting condition is characterized by chronic respiratory infections necessitating daily chest therapies, pancreatic dysfunction disrupting the body's ability to break down food and extract nutrients as it should, and possible infertility in males.  With the advent of new therapies, the average lifespan for people with CF is in the 30's.  Symptoms can be considerably variable, and in some cases symptoms may be quite mild.  Sometimes, the specific mutations a person carries can indicate whether their symptoms may be associated with milder or more severe symptoms; this is not always the case.   We reviewed that carrier screening assesses for the most common disease causing CFTR mutations and detects approximately 90% of carriers in the Caucasian population. Thus, a negative carrier screen would reduce but not eliminate the chance to be a carrier. In the case that both partners are identified to be carriers, prenatal diagnosis via amniocentesis would be available in the pregnancy.  We also discussed that in [place], the newborn screening test will detect CF, but that carriers may come back as false positives.  [name] was encouraged to contact our office should she have questions once the CF carrier screening results are available for the father of the pregnancy.   [name] denied exposure to environmental toxins or chemical agents. She denied the use of alcohol or  street drugs. She reported smoking only during the first trimester of pregnancy and no smoking currently. She denied significant viral illnesses during the course of her pregnancy. Her medical and surgical histories were contributory for insulin dependent diabetes mellitus as previously discussed.   I counseled [name] regarding the above risks and available options.  The approximate face-to-face time with the genetic counselor was 45 minutes.  Quinn Plowman, MS Certified Genetic Counselor 09/28/2013

## 2013-10-01 ENCOUNTER — Other Ambulatory Visit (HOSPITAL_COMMUNITY): Payer: Self-pay | Admitting: Obstetrics and Gynecology

## 2013-10-01 ENCOUNTER — Telehealth (HOSPITAL_COMMUNITY): Payer: Self-pay | Admitting: MS"

## 2013-10-01 DIAGNOSIS — O289 Unspecified abnormal findings on antenatal screening of mother: Secondary | ICD-10-CM

## 2013-10-01 NOTE — Telephone Encounter (Signed)
Called patient back regarding adding in appointment for amniocentesis this week. Left message in voicemail that the best time to add in patient for amniocentesis would be tomorrow, 10/28, at 8:00 am. Expressed understanding in the message that this is short notice. Asked patient to call me back directly at my desk number to confirm if this appointment time will work for her.   Clydie Braun Kalub Morillo 10/01/2013 5:22 PM

## 2013-10-01 NOTE — Telephone Encounter (Signed)
Called patient's home number in attempt to reach regarding add-on amnio for tomorrow morning. Patient's father answered. He stated that her mother was going to pick [name] up at work given that she was very upset. He is unsure if patient will be able to make 8:00 am appointment tomorrow given that her mother cannot accompany her to this appointment. He stated that if it is in the afternoon, he may be able to come with her. I asked for [name] to call back our main office number [phone], either way. If she is unable to make appointment, we will find another time to work her in, and we will call her tomorrow, if that is the case.   Clydie Braun Reida Hem 10/01/2013 5:30 PM

## 2013-10-01 NOTE — Telephone Encounter (Signed)
Called [name] Link to discuss her cell free fetal DNA test results.  [name] had Panorama testing through [facility].  Testing was offered because of abnormal ultrasound findings.   The patient was identified by name and DOB.  We reviewed that the result indicates a high risk, >99%, for fetal trisomy 18 in the pregnancy. We reviewed that this is not considered diagnostic and a false positive rate of 0.1% is reported. However, given this result and the previous ultrasound findings, this diagnosis is likely suspected in the pregnancy. The patient understands that this cannot distinguish between trisomy 70, mosaic trisomy 49, partial trisomy 35, or trisomy 18 confined to the placenta. We reviewed the option of amniocentesis to confirm the diagnosis. We also reviewed the options of continuing the pregnancy versus termination of pregnancy. The patient stated that she would like to return for amniocentesis to confirm this result in the pregnancy. She stated that she would possibly also use this information to help decide whether or not to continue the pregnancy. Offered the patient follow-up genetic counseling to review these results in more detail. She declined at this time, stating that she preferred to return for amniocentesis. I planned to look for availability for amniocentesis for this week and call patient back at this same number shortly. Given the urgency to schedule amniocentesis, we did not discuss the remainder of the cell free DNA testing results at this time.   Quinn Plowman, MS Certified Genetic Counselor 10/01/2013 5:20 PM

## 2013-10-02 ENCOUNTER — Telehealth (HOSPITAL_COMMUNITY): Payer: Self-pay | Admitting: MS"

## 2013-10-02 ENCOUNTER — Ambulatory Visit (HOSPITAL_COMMUNITY): Admission: RE | Admit: 2013-10-02 | Payer: 59 | Source: Ambulatory Visit

## 2013-10-02 ENCOUNTER — Encounter (HOSPITAL_COMMUNITY): Payer: Self-pay

## 2013-10-02 NOTE — Telephone Encounter (Signed)
Patient called to say that she has decided not to pursue amniocentesis in the pregnancy. She is planning to continue the pregnancy, and she stated that the information that could be provided by amniocentesis could also be provided to her after birth. Confirmed this with her saying that postnatal chromosome analysis can be performed for the pregnancy. Patient has follow-up ultrasound scheduled in our office on 10/15/13. Offered patient follow-up genetic counseling to review results in more detail and discuss available support resources. Patient declined scheduled genetic counseling at this time but stated that she would ask to speak with me at time of her ultrasound if additional questions arise. She stated that her OB and the pediatricians at her place of employment have helped provide her with resources on Trisomy 27.   We also reviewed that the remainder of the results from her cell free fetal DNA testing (Panorama) are within normal limits, showing a less than 1 in 10,000 risk for trisomies 21, 13, and monosomy X (Turner syndrome).  In addition, the risk for triploidy/vanishing twin and sex chromosome trisomies (47,XXX and 47,XXY) was also low risk.  This testing also revealed low risk for the microdeletion syndromes for which it screened. Testing was also consistent with female gender.  The patient did wish to know gender.  She understands that this testing does not identify all genetic conditions.  All questions were answered to her satisfaction, she was encouraged to call with additional questions or concerns.  Quinn Plowman, MS Certified Genetic Counselor 10/02/2013 3:36 PM

## 2013-10-02 NOTE — Telephone Encounter (Signed)
Left message for patient that I was unaware that our office does not have voicemail capabilities. If she called and left a message, we are unable to check it. Asked her to call our office if she is still interested in amniocentesis.   Clydie Braun Ronnie Doo 10/02/2013 9:04 AM

## 2013-10-02 NOTE — Addendum Note (Signed)
Encounter addended by: Alessandra Bevels. Chase Picket, RN on: 10/02/2013 11:11 AM<BR>     Documentation filed: OB Dating

## 2013-10-15 ENCOUNTER — Ambulatory Visit (HOSPITAL_COMMUNITY)
Admission: RE | Admit: 2013-10-15 | Discharge: 2013-10-15 | Disposition: A | Discharge status: Home / Self Care | Payer: 59 | Source: Ambulatory Visit | Attending: Obstetrics and Gynecology | Admitting: Obstetrics and Gynecology

## 2013-10-15 VITALS — BP 119/86 | HR 102 | Wt 124.0 lb

## 2013-10-15 DIAGNOSIS — O358XX0 Maternal care for other (suspected) fetal abnormality and damage, not applicable or unspecified: Secondary | ICD-10-CM | POA: Diagnosis not present

## 2013-10-15 DIAGNOSIS — IMO0002 Reserved for concepts with insufficient information to code with codable children: Secondary | ICD-10-CM

## 2013-10-15 DIAGNOSIS — O3510X0 Maternal care for (suspected) chromosomal abnormality in fetus, unspecified, not applicable or unspecified: Secondary | ICD-10-CM | POA: Insufficient documentation

## 2013-10-15 DIAGNOSIS — O24919 Unspecified diabetes mellitus in pregnancy, unspecified trimester: Secondary | ICD-10-CM | POA: Insufficient documentation

## 2013-10-15 DIAGNOSIS — E1065 Type 1 diabetes mellitus with hyperglycemia: Secondary | ICD-10-CM

## 2013-10-15 DIAGNOSIS — O351XX0 Maternal care for (suspected) chromosomal abnormality in fetus, not applicable or unspecified: Secondary | ICD-10-CM | POA: Diagnosis present

## 2013-10-15 NOTE — Progress Notes (Signed)
[name]  was seen today for an ultrasound appointment.  See full report in [facility].  Impression: Single IUP at 21 3/7 weeks NIPS - Trisomy 18.  Patient declined amniocentesis Multiple fetal anomalies  again noted - Abnormal left arm (very shortened/ abnormal forearm),  left clubbed foot, absent vs. small stomach bubble, single umbilical artery Mesocardia,  membranous VSD noted on recent fetal echo Septum cavum pelucidum not seen Abnormal fetal profile - appears "flattened" Suspected fetal growth restriction with EFW < 10th %tile. Normal amniotic fluid volume  Ultrasound findings and limitations again discussed.  The patient is aware of the poor prognosis for the fetus - risk of stillbirth.  Aware that most newborns with Trisomy 52 do not survive beyond the first year of life.  Recommendations: Recommend follow up in 4 weeks. Will discuss/ consult for neonatal hospice at next follow up.  Alpha Gula, MD

## 2013-10-16 NOTE — Addendum Note (Signed)
Encounter addended by: Alessandra Bevels. Chase Picket, RN on: 10/16/2013 11:32 AM<BR>     Documentation filed: Charges VN

## 2013-11-12 ENCOUNTER — Ambulatory Visit (HOSPITAL_COMMUNITY)
Admission: RE | Admit: 2013-11-12 | Discharge: 2013-11-12 | Disposition: A | Discharge status: Home / Self Care | Payer: 59 | Source: Ambulatory Visit | Attending: Obstetrics and Gynecology | Admitting: Obstetrics and Gynecology

## 2013-11-12 DIAGNOSIS — E1065 Type 1 diabetes mellitus with hyperglycemia: Secondary | ICD-10-CM

## 2013-11-12 DIAGNOSIS — IMO0002 Reserved for concepts with insufficient information to code with codable children: Secondary | ICD-10-CM

## 2013-11-12 DIAGNOSIS — O351XX0 Maternal care for (suspected) chromosomal abnormality in fetus, not applicable or unspecified: Secondary | ICD-10-CM | POA: Insufficient documentation

## 2013-11-12 DIAGNOSIS — O24919 Unspecified diabetes mellitus in pregnancy, unspecified trimester: Secondary | ICD-10-CM | POA: Diagnosis not present

## 2013-11-12 DIAGNOSIS — O358XX0 Maternal care for other (suspected) fetal abnormality and damage, not applicable or unspecified: Secondary | ICD-10-CM | POA: Diagnosis not present

## 2013-11-12 DIAGNOSIS — O3510X0 Maternal care for (suspected) chromosomal abnormality in fetus, unspecified, not applicable or unspecified: Secondary | ICD-10-CM | POA: Insufficient documentation

## 2013-11-12 NOTE — Consult Note (Signed)
  Neonatology Consult  Note:  At the request of the patients obstetrician Dr. Senaida Ores I met Ms. Link (father, patient's sister and MGM were also present).  Pregnancy complicated by prenatal diagnosis of Trisomy 44 by Panorama testing (amniocentesis declined) in conjunction with US findings consent with this diagnosis.  She presented again today for an [name] which showed a single IUP at 21 3/7 weeks with multiple fetal anomalies noted including-  Abnormal left arm (very shortened/ abnormal forearm), left clubbed foot, absent vs. small stomach bubble, single umbilical artery  Mesocardia, membranous VSD noted on recent fetal echo  Septum cavum pelucidum not seen  Abnormal fetal profile - appears "flattened"  Suspected fetal growth restriction with EFW < 10th %tile.   Per mother recent fetal echo also showed a small PA and small right ventricle however this report is not available for review at this time.  We discussed the clinical variability of babies with Trisomy 18 in regards to respiratory effort, apnea, cyanosis (especially in the setting of pulmonary atresia), feeding difficulties / GI anomalies and temperature instability.  She is aware of the poor prognosis for the fetus - both in terms of the risk of stillbirth and the postnatal course in which most newborns with Trisomy 61 do not survive beyond the first year of life and in males the average survival is 1.4 months.  She stated that she does not wish for him to have intensive interventions such as intubation or high flow nasal cannula.  Should he have apnea or marked respiratory distress she wishes to hold him instead of having him transferred to the NICU for intensive care.  We discussed that admission to the NICU might be appropriate in order to provide a "bridge" to home - in order to establish oxygen therapy or NG feeds which could then be continued by home hospice.    Thank you for allowing [name] to participate in her care.    John Giovanni, DO  Neonatologist   The total length of face-to-face or floor / unit time for this encounter was 40 minutes.  Counseling and / or coordination of care was greater than fifty percent of the time.

## 2013-11-13 ENCOUNTER — Other Ambulatory Visit (HOSPITAL_COMMUNITY): Payer: Self-pay | Admitting: Maternal and Fetal Medicine

## 2013-11-13 ENCOUNTER — Telehealth (HOSPITAL_COMMUNITY): Payer: Self-pay | Admitting: MS"

## 2013-11-13 DIAGNOSIS — E1065 Type 1 diabetes mellitus with hyperglycemia: Secondary | ICD-10-CM

## 2013-11-13 DIAGNOSIS — IMO0002 Reserved for concepts with insufficient information to code with codable children: Secondary | ICD-10-CM

## 2013-11-13 NOTE — Telephone Encounter (Signed)
Spoke with coordinator at [facility]	[place] regarding referral for perinatal hospice for patient. [name] had previously given [name] her permission for [name] to contact [facility] and provide them her contact information. [facility] coordinator planned to contact patient.   Clydie Braun Gerline Ratto 11/13/2013 4:30 PM

## 2013-11-19 ENCOUNTER — Other Ambulatory Visit (HOSPITAL_COMMUNITY): Payer: Self-pay | Admitting: Obstetrics and Gynecology

## 2013-11-19 DIAGNOSIS — O24919 Unspecified diabetes mellitus in pregnancy, unspecified trimester: Secondary | ICD-10-CM

## 2013-11-21 ENCOUNTER — Encounter (HOSPITAL_COMMUNITY): Payer: Self-pay

## 2013-11-21 ENCOUNTER — Inpatient Hospital Stay (HOSPITAL_COMMUNITY)
Admission: AD | Admit: 2013-11-21 | Discharge: 2013-11-22 | DRG: 781 | Disposition: A | Discharge status: Home / Self Care | Payer: 59 | Source: Ambulatory Visit | Attending: Obstetrics and Gynecology | Admitting: Obstetrics and Gynecology

## 2013-11-21 ENCOUNTER — Encounter (HOSPITAL_COMMUNITY): Payer: Self-pay | Admitting: *Deleted

## 2013-11-21 ENCOUNTER — Ambulatory Visit (HOSPITAL_COMMUNITY)
Admission: RE | Admit: 2013-11-21 | Discharge: 2013-11-21 | Disposition: A | Discharge status: Home / Self Care | Payer: 59 | Source: Ambulatory Visit | Attending: Family Medicine | Admitting: Family Medicine

## 2013-11-21 DIAGNOSIS — E1065 Type 1 diabetes mellitus with hyperglycemia: Secondary | ICD-10-CM

## 2013-11-21 DIAGNOSIS — O358XX0 Maternal care for other (suspected) fetal abnormality and damage, not applicable or unspecified: Secondary | ICD-10-CM

## 2013-11-21 DIAGNOSIS — O24919 Unspecified diabetes mellitus in pregnancy, unspecified trimester: Secondary | ICD-10-CM

## 2013-11-21 DIAGNOSIS — E1142 Type 2 diabetes mellitus with diabetic polyneuropathy: Secondary | ICD-10-CM | POA: Diagnosis present

## 2013-11-21 DIAGNOSIS — O368390 Maternal care for abnormalities of the fetal heart rate or rhythm, unspecified trimester, not applicable or unspecified: Secondary | ICD-10-CM | POA: Diagnosis not present

## 2013-11-21 DIAGNOSIS — O3510X0 Maternal care for (suspected) chromosomal abnormality in fetus, unspecified, not applicable or unspecified: Principal | ICD-10-CM | POA: Diagnosis present

## 2013-11-21 DIAGNOSIS — Z141 Cystic fibrosis carrier: Secondary | ICD-10-CM

## 2013-11-21 DIAGNOSIS — E1049 Type 1 diabetes mellitus with other diabetic neurological complication: Secondary | ICD-10-CM | POA: Diagnosis present

## 2013-11-21 DIAGNOSIS — O351XX1 Maternal care for (suspected) chromosomal abnormality in fetus, fetus 1: Secondary | ICD-10-CM

## 2013-11-21 DIAGNOSIS — Z87891 Personal history of nicotine dependence: Secondary | ICD-10-CM

## 2013-11-21 DIAGNOSIS — O351XX0 Maternal care for (suspected) chromosomal abnormality in fetus, not applicable or unspecified: Principal | ICD-10-CM | POA: Diagnosis present

## 2013-11-21 DIAGNOSIS — O24912 Unspecified diabetes mellitus in pregnancy, second trimester: Secondary | ICD-10-CM

## 2013-11-21 DIAGNOSIS — Q913 Trisomy 18, unspecified: Secondary | ICD-10-CM

## 2013-11-21 HISTORY — DX: Herpesviral infection, unspecified: B00.9

## 2013-11-21 LAB — CBC
Hemoglobin: 15.6 g/dL — ABNORMAL HIGH (ref 12.0–15.0)
MCH: 32.7 pg (ref 26.0–34.0)
MCHC: 35.3 g/dL (ref 30.0–36.0)
MCV: 92.7 fL (ref 78.0–100.0)
RBC: 4.77 MIL/uL (ref 3.87–5.11)
RDW: 12.1 % (ref 11.5–15.5)
WBC: 13.2 10*3/uL — ABNORMAL HIGH (ref 4.0–10.5)

## 2013-11-21 LAB — URINALYSIS, ROUTINE W REFLEX MICROSCOPIC
Bilirubin Urine: NEGATIVE
Glucose, UA: 500 mg/dL — AB
Ketones, ur: 15 mg/dL — AB
Leukocytes, UA: NEGATIVE
Nitrite: NEGATIVE
Protein, ur: 30 mg/dL — AB
Urobilinogen, UA: 0.2 mg/dL (ref 0.0–1.0)

## 2013-11-21 LAB — COMPREHENSIVE METABOLIC PANEL
Alkaline Phosphatase: 84 U/L (ref 39–117)
CO2: 22 mEq/L (ref 19–32)
Calcium: 10.1 mg/dL (ref 8.4–10.5)
Chloride: 94 mEq/L — ABNORMAL LOW (ref 96–112)
Creatinine, Ser: 0.59 mg/dL (ref 0.50–1.10)
GFR calc Af Amer: >90 mL/min (ref >90)
GFR calc non Af Amer: >90 mL/min (ref >90)
Glucose, Bld: 140 mg/dL — ABNORMAL HIGH (ref 70–99)
Sodium: 133 mEq/L — ABNORMAL LOW (ref 135–145)
Total Protein: 8.6 g/dL — ABNORMAL HIGH (ref 6.0–8.3)

## 2013-11-21 LAB — LACTATE DEHYDROGENASE: LDH: 363 U/L — ABNORMAL HIGH (ref 94–250)

## 2013-11-21 LAB — URIC ACID: Uric Acid, Serum: 6.1 mg/dL (ref 2.4–7.0)

## 2013-11-21 LAB — URINE MICROSCOPIC-ADD ON

## 2013-11-21 LAB — GLUCOSE, CAPILLARY: Glucose-Capillary: 184 mg/dL — ABNORMAL HIGH (ref 70–99)

## 2013-11-21 MED ORDER — INSULIN ASPART 100 UNIT/ML ~~LOC~~ SOLN
1.0000 [IU] | Freq: Three times a day (TID) | SUBCUTANEOUS | Status: DC
  Administered 2013-11-21: 5 [IU] via SUBCUTANEOUS
  Administered 2013-11-22: 3 [IU] via SUBCUTANEOUS

## 2013-11-21 MED ORDER — INSULIN ASPART 100 UNIT/ML ~~LOC~~ SOLN
1.0000 [IU] | Freq: Three times a day (TID) | SUBCUTANEOUS | Status: DC
  Administered 2013-11-22: 10 [IU] via SUBCUTANEOUS

## 2013-11-21 MED ORDER — SODIUM CHLORIDE 0.9 % IV SOLN: 250.0000 mL | INTRAVENOUS | Status: DC | PRN

## 2013-11-21 MED ORDER — SODIUM CHLORIDE 0.9 % IJ SOLN
3.0000 mL | INTRAMUSCULAR | Status: DC | PRN
  Administered 2013-11-22: 3 mL via INTRAVENOUS

## 2013-11-21 MED ORDER — DOCUSATE SODIUM 100 MG PO CAPS
100.0000 mg | ORAL_CAPSULE | Freq: Every day | ORAL | Status: DC
  Administered 2013-11-22: 100 mg via ORAL
  Filled 2013-11-21: qty 1

## 2013-11-21 MED ORDER — ZOLPIDEM TARTRATE 5 MG PO TABS: 5.0000 mg | ORAL_TABLET | Freq: Every evening | ORAL | Status: DC | PRN

## 2013-11-21 MED ORDER — INSULIN DETEMIR 100 UNIT/ML ~~LOC~~ SOLN
6.0000 [IU] | Freq: Every day | SUBCUTANEOUS | Status: DC
  Administered 2013-11-21: 6 [IU] via SUBCUTANEOUS
  Filled 2013-11-21 (×2): qty 0.06

## 2013-11-21 MED ORDER — INSULIN DETEMIR 100 UNIT/ML ~~LOC~~ SOLN
17.0000 [IU] | Freq: Every day | SUBCUTANEOUS | Status: DC
  Administered 2013-11-22: 17 [IU] via SUBCUTANEOUS
  Filled 2013-11-21 (×2): qty 0.17

## 2013-11-21 MED ORDER — INSULIN DETEMIR 100 UNIT/ML ~~LOC~~ SOLN
6.0000 [IU] | Freq: Every day | SUBCUTANEOUS | Status: DC
  Filled 2013-11-21 (×2): qty 0.06

## 2013-11-21 MED ORDER — CALCIUM CARBONATE ANTACID 500 MG PO CHEW: 2.0000 | CHEWABLE_TABLET | ORAL | Status: DC | PRN

## 2013-11-21 MED ORDER — SODIUM CHLORIDE 0.9 % IJ SOLN
3.0000 mL | Freq: Two times a day (BID) | INTRAMUSCULAR | Status: DC
  Administered 2013-11-21 – 2013-11-22 (×2): 3 mL via INTRAVENOUS

## 2013-11-21 MED ORDER — PRENATAL MULTIVITAMIN CH
1.0000 | ORAL_TABLET | Freq: Every day | ORAL | Status: DC
  Administered 2013-11-22: 1 via ORAL
  Filled 2013-11-21: qty 1

## 2013-11-21 NOTE — Consult Note (Signed)
Asked by Dr. Ambrose Mantle to provide prenatal consultation for patient whose prenatal studies show fetus with Trisomy 18 and multiple anomalies, including congenital heart disease (probable pulmonary atresia or stenosis).  She was admitted tonight because of Doppler flow studies showing reverse end-diastolic flow, and mother has requested C/section if needed to allow infant to be born alive.  BPP was 8/8 so no immediate plans for intervention, but fetus will be monitored per the mother's request.  Spoke with patient, and her mother and a sister were also present (with patient's permission).  She has spoken with Dr. Algernon Huxley (see note of 12/8) and subsequently with [facility] counselor and maintains that she does not want life-prolonging intervention for infant after birth.  She has requested comfort care only and understands that if she delivers at this gestation that hospice care at home would not be possible (this had been discussed in the eventuality that she might delivery close to term and short-term survival was considered possible).  Mother reiterated with me her desire to avoid intubation, surgery, and invasive procedures.  I agreed to facilitate comfort care with infant being allowed to be with her for skin-to-skin contact and provision of non-intensive environment, inclusion of family, etc., as much as possible..  Patient was attentive, had appropriate questions, and was appreciative of my input.  Thank you for the consultation.  [name], MD Face-to-face time 20 minutes

## 2013-11-21 NOTE — Progress Notes (Signed)
Patient ID: TOMEEKA PLAUGHER, female   DOB: 06-Jul-1991, 22 y.o.   MRN: [id number] This pt is admitted for monitoring because even though she has a T18 fetus she is adamant that the baby not die in utero. The baby has multiple anomalies and has reversed diastolic flow on doppler but the BPP is 8/8. The pt was encouraged to go the [facility] where surgery could be done but she states she does not want the baby to have surgery, she just wants the baby not to die inside her body.Dr. Eric Form will see her tonight.

## 2013-11-22 ENCOUNTER — Encounter (HOSPITAL_COMMUNITY): Payer: Self-pay | Admitting: *Deleted

## 2013-11-22 ENCOUNTER — Inpatient Hospital Stay (HOSPITAL_COMMUNITY): Payer: 59

## 2013-11-22 LAB — GLUCOSE, CAPILLARY
Glucose-Capillary: 142 mg/dL — ABNORMAL HIGH (ref 70–99)
Glucose-Capillary: 48 mg/dL — ABNORMAL LOW (ref 70–99)
Glucose-Capillary: 77 mg/dL (ref 70–99)

## 2013-11-22 NOTE — Discharge Summary (Signed)
Physician Discharge Summary  Patient ID: [name] MRN: [id number] DOB/AGE: 02-12-1991 22 y.o.  Admit date: 11/21/2013 Discharge date: 11/22/2013  Admission Diagnoses: Abnormal Dopplers at 26+ weeks                                         Trisomy 18 pregnancy  Discharge Diagnoses: same Active Problems:   Trisomy 18   Discharged Condition: stable  Hospital Course: Pt was admitted to observation after had an MFM [name] that revealed dopplers with absent/reversed end diastolic flow.  She expressed a wish for intervention in the event of fetal distress to obtain a live birth but has no plans to resuscitate the baby after delivery given his lethal anomaly.  The intermittent monitoring was reassuring and Dopplers the following day still elevated but no absent or reversed flow.  The patient had consult with MFM, NICU and extensive conversations with me during her admission.  We discussed the negatives of a c-section for her premature fetus including surgical risk, possible classical c-section and its implications on future pregnancies, as well as fetal issues.  She understands we do not recommend the c-section, but still states she wants a live birth if possible.  After careful consideration, she would like to go home and return for f/u dopplers next week with MFM.  She understands that she could have a stillbirth in the interim and accepts that.  Consults:NICU, MFM  Significant Diagnostic Studies: [name] and Dopplers  Treatments: fetal monitoring  Discharge Exam: Blood pressure 123/89, pulse 101, temperature 98.1 F (36.7 C), temperature source Oral, resp. rate 18, height 5\' 1"  (1.549 m), weight 60.782 kg (134 lb), last menstrual period 05/08/2013. General appearance: alert and cooperative GI: soft , NT, gravid  Disposition: 01-Home or Self Care      Discharge Orders   Future Appointments Provider Department Dept Phone   11/26/2013 12:30 PM [facility] [name] 1 [facility] [place]  [facility] [phone]   11/28/2013 12:00 PM [facility] [name] 1 [facility] [place] [facility] [phone]   12/05/2013 1:15 PM [facility] [name] 2 [facility] [place] [facility]TR[facility] [phone]   Future Orders Complete By Expires   Diet Carb Modified  As directed    Increase activity slowly  As directed        Medication List         EPINEPHrine 0.3 mg/0.3 mL Soaj injection  Commonly known as:  EPI-PEN  Inject into the muscle once.     insulin aspart 100 UNIT/ML injection  Commonly known as:  novoLOG  Inject 1-17 Units into the skin 3 (three) times daily before meals. Patient injects 1 unit per 3 g of carbs. Patient injects insulin before eating so therefore may use this more than 3 times daily.     insulin detemir 100 UNIT/ML injection  Commonly known as:  LEVEMIR  Inject 6-17 Units into the skin 2 (two) times daily. Patient injects 17 units in the morning and 6 units at bedtime.     ONE TOUCH ULTRA TEST test strip  Generic drug:  glucose blood  USE TO CHECK BLOOD SUGAR 6 TO 8 TIMES A DAY     glucose blood test strip  Commonly known as:  ONETOUCH VERIO  Check blood sugar 10 x daily     prenatal multivitamin Tabs tablet  Take 1 tablet by mouth at bedtime.  valACYclovir 500 MG tablet  Commonly known as:  VALTREX  Take 500 mg by mouth 2 (two) times daily as needed (For outbreak.).       Follow-up Information   Follow up with Oliver Pila, MD. Schedule an appointment as soon as possible for a visit in 5 days. (Mon or tues in office 12/22 or 12/23)    Specialty:  Obstetrics and Gynecology   Contact information:   [address], [address] [place] [address] [phone]       Follow up with Oliver Pila, MD On 11/26/2013. (09:15)    Specialty:  Obstetrics and Gynecology   Contact information:   [address], [address] [place] [address] [phone]       Signed: Oliver Pila 11/22/2013, 5:55 PM

## 2013-11-22 NOTE — Progress Notes (Signed)
Patient ID: [name], female   DOB: 08-Feb-1991, 22 y.o.   MRN: [id number] HD #2  Trisomy 31 with IUGR and abnormal Dopplers  Pt had ok night  FHR on intermittent monitoring with some variable decels, stable otherwise PIH labs WNL yesterday, BP improved Trace proteinuria is probably her baseline nephropathy BS elevated this AM to 200+ which is unusual for her.   She did a corrective dose  We had a very long discussion about the implications af a probable classical c-section to deliver her baby if we intervene to have a livebirth I talked to her about impact on future pregnancies and the fact that she would always need c-sections We discussed that the baby would likely live a very short time with no support and that we could let him lay with her in the OR but that she would be constrained by being on the OR table somewhat. We discussed other options of expectant management with intervention once the baby has no heartbeat, or actively inducing labor with the understanding the baby may not survive the process.  I do not think she has preeclampsia BS managed with corrective doses  She will consider all options.  I told her if she can reconcile to a stillbirth, that I would recommend d/c home and f/u in the office at least weekly for FHT's.

## 2013-11-22 NOTE — Progress Notes (Signed)
Patient ID: [name], female   DOB: [birth year], 22 y.o.   MRN: [id number] Pt had MFM [name] today and Dopplers still elevated but no reversal or absent end diastolic flow. Pt still would like to have a live birth if able, but we have talked about how difficult this will be to predict and that we cannot guarantee that we will catch fetal distress soon enough to intervene.  She understands this and would like to go home.  We will see her in office 12/22 to make sure BP and BS stable.  She has repeat dopplers with MFM on 12/22.  The patient understands that she could have a fetal demise at any point and in that event she would just have labor induced.

## 2013-11-23 ENCOUNTER — Other Ambulatory Visit (HOSPITAL_COMMUNITY): Payer: Self-pay | Admitting: Obstetrics and Gynecology

## 2013-11-23 DIAGNOSIS — O351XX1 Maternal care for (suspected) chromosomal abnormality in fetus, fetus 1: Secondary | ICD-10-CM

## 2013-11-23 NOTE — H&P (Signed)
NAMEAllen Mckinney               ACCOUNT NO.:  [id number]  MEDICAL RECORD NO.:  [id number]  LOCATION:                                 FACILITY:  PHYSICIAN:  Malachi Pro. Ambrose Mantle, M.D. DATE OF BIRTH:  Oct 29, 1991  DATE OF ADMISSION:  11/21/2013 DATE OF DISCHARGE:                             HISTORY & PHYSICAL   HISTORY OF PRESENT ILLNESS:  This is a 22 year old white female para 0, gravida 1, at 26 weeks and 5 days gestation with [facility] of February 22, 2014, admitted because of growth restriction, trisomy 54, possible TE fistula, short radii, VSD and Doppler showing reversed end-diastolic flow.  The patient is admitted because the patient wants everything done to save her baby.  PAST MEDICAL HISTORY:  She does have type 1 diabetes.  She has neuropathy from that.  She has nephritis and nephropathy.  SURGICAL HISTORY:  Renal biopsy in 2001.  ALLERGIES:  Sulfa caused hives, nausea, and vomiting.  She does not have a latex allergy.  She also has a history of genital herpes simplex type 1.  SOCIAL HISTORY:  She is a former smoker, before pregnancy occasionally drank alcohol.  Has had 12 years of education.  She is in college.  FAMILY HISTORY:  Father had a CVA at age 80.  Paternal grandfather had a coronary arteriosclerosis, maternal grandmother had coronary arteriosclerosis.  Paternal grandmother intracranial tumor.  DIAGNOSTIC DATA:  Blood group and type B negative with a negative antibody, rubella not immune, RPR negative.  Urine culture negative. Hepatitis B surface antigen negative, HIV negative, GC and Chlamydia negative.  Cystic fibrosis positive.  Father of the baby, negative for cystic fibrosis.  The patient had an hemoglobin A1c of 8.3 on August 10, 2013, 10.9 on June 13, 2013.  She began her prenatal course in our office at 10 weeks and 6 days and declined genetic screenings.  An ultrasound at 18 weeks showed a 2-vessel cord, possibly bilateral clubfeet, abnormal hands and  wrists, possible absence of the corpus callosum, multiple tiny cysts, choroid plexus cysts, small for gestational age.  The patient saw Pediatric Cardiology and a VSD was noted.  The patient had an amniocentesis that showed trisomy 9.  She told Dr. Senaida Ores at a visit on November 07, 2013, that she would like a cesarean section if the baby went into distress at term, but she was not sure about preterm distress.  The patient was seen in the MFM today and was found to have a Doppler with reversed diastolic flow.  Dr. Sherrie George called me and asked me to admit the patient to the hospital, since the patient stated that she wanted everything done for the baby. She is advised that we do daily Dopplers, with polyhydramnios it might be difficult to monitor the baby but that may short segments of monitoring.  At her last visit in the office, her blood pressure is 120/80, pulse is normal.  Heart normal size and sounds.  No murmurs. Lungs are clear to auscultation.  At her last prenatal visit in our office on November 07, 2013, her fundal height was 26 cm.  Fetal heart tones were present.  They are present in the  MFM today and as a matter of fact, the baby has a BPP of 8/8.  ADMITTING IMPRESSION:  Intrauterine pregnancy, 26 weeks 5 days.  Fetus with multiple congenital anomalies, type 1 diabetes.  The patient insists that everything be done for her baby.  I encouraged Dr. Sherrie George to have her be transferred to [facility] [place], so that if the physician after delivery chose to do surgery on the baby that they are equipped to do where as nobody in [place] can do surgery on the baby's heart, but the patient declined and wanted to stay in [place]. At the present time, we will try to monitor the baby daily with Dopplers and try to cooperate with the patient's desires as much as possible.     Malachi Pro. Ambrose Mantle, M.D.     TFH/MEDQ  D:  11/21/2013  T:  11/21/2013  Job:  [id number]

## 2013-11-26 ENCOUNTER — Ambulatory Visit (HOSPITAL_COMMUNITY)
Admit: 2013-11-26 | Discharge: 2013-11-26 | Disposition: A | Discharge status: Home / Self Care | Payer: 59 | Attending: Obstetrics and Gynecology | Admitting: Obstetrics and Gynecology

## 2013-11-26 DIAGNOSIS — O358XX0 Maternal care for other (suspected) fetal abnormality and damage, not applicable or unspecified: Secondary | ICD-10-CM | POA: Insufficient documentation

## 2013-11-26 DIAGNOSIS — O351XX0 Maternal care for (suspected) chromosomal abnormality in fetus, not applicable or unspecified: Secondary | ICD-10-CM | POA: Insufficient documentation

## 2013-11-26 DIAGNOSIS — O24919 Unspecified diabetes mellitus in pregnancy, unspecified trimester: Secondary | ICD-10-CM | POA: Insufficient documentation

## 2013-11-26 DIAGNOSIS — O3510X0 Maternal care for (suspected) chromosomal abnormality in fetus, unspecified, not applicable or unspecified: Secondary | ICD-10-CM | POA: Insufficient documentation

## 2013-11-26 DIAGNOSIS — O351XX1 Maternal care for (suspected) chromosomal abnormality in fetus, fetus 1: Secondary | ICD-10-CM

## 2013-11-27 ENCOUNTER — Other Ambulatory Visit (HOSPITAL_COMMUNITY): Payer: Self-pay | Admitting: Obstetrics and Gynecology

## 2013-11-27 DIAGNOSIS — E109 Type 1 diabetes mellitus without complications: Secondary | ICD-10-CM

## 2013-11-28 ENCOUNTER — Ambulatory Visit (HOSPITAL_COMMUNITY): Admission: RE | Admit: 2013-11-28 | Payer: 59 | Source: Ambulatory Visit

## 2013-11-30 ENCOUNTER — Other Ambulatory Visit: Payer: Self-pay | Admitting: "Endocrinology

## 2013-12-03 ENCOUNTER — Ambulatory Visit (HOSPITAL_COMMUNITY)
Admission: RE | Admit: 2013-12-03 | Discharge: 2013-12-03 | Disposition: A | Discharge status: Home / Self Care | Payer: 59 | Source: Ambulatory Visit | Attending: Family Medicine | Admitting: Family Medicine

## 2013-12-03 ENCOUNTER — Other Ambulatory Visit (HOSPITAL_COMMUNITY): Payer: Self-pay | Admitting: Obstetrics and Gynecology

## 2013-12-03 DIAGNOSIS — O3510X0 Maternal care for (suspected) chromosomal abnormality in fetus, unspecified, not applicable or unspecified: Secondary | ICD-10-CM | POA: Insufficient documentation

## 2013-12-03 DIAGNOSIS — O24919 Unspecified diabetes mellitus in pregnancy, unspecified trimester: Secondary | ICD-10-CM | POA: Insufficient documentation

## 2013-12-03 DIAGNOSIS — E109 Type 1 diabetes mellitus without complications: Secondary | ICD-10-CM

## 2013-12-03 DIAGNOSIS — O358XX0 Maternal care for other (suspected) fetal abnormality and damage, not applicable or unspecified: Secondary | ICD-10-CM | POA: Insufficient documentation

## 2013-12-03 DIAGNOSIS — O351XX1 Maternal care for (suspected) chromosomal abnormality in fetus, fetus 1: Secondary | ICD-10-CM

## 2013-12-03 DIAGNOSIS — O24912 Unspecified diabetes mellitus in pregnancy, second trimester: Secondary | ICD-10-CM

## 2013-12-03 DIAGNOSIS — O351XX0 Maternal care for (suspected) chromosomal abnormality in fetus, not applicable or unspecified: Secondary | ICD-10-CM | POA: Insufficient documentation

## 2013-12-03 NOTE — Telephone Encounter (Signed)
Patient needs to schedule appointment  

## 2013-12-04 ENCOUNTER — Ambulatory Visit (HOSPITAL_COMMUNITY)
Admission: RE | Admit: 2013-12-04 | Discharge: 2013-12-04 | Disposition: A | Discharge status: Home / Self Care | Payer: 59 | Source: Ambulatory Visit | Attending: Obstetrics and Gynecology | Admitting: Obstetrics and Gynecology

## 2013-12-04 ENCOUNTER — Other Ambulatory Visit (HOSPITAL_COMMUNITY): Payer: Self-pay | Admitting: Obstetrics and Gynecology

## 2013-12-04 ENCOUNTER — Encounter (HOSPITAL_COMMUNITY): Payer: Self-pay

## 2013-12-04 DIAGNOSIS — O351XX0 Maternal care for (suspected) chromosomal abnormality in fetus, not applicable or unspecified: Secondary | ICD-10-CM | POA: Insufficient documentation

## 2013-12-04 DIAGNOSIS — O24912 Unspecified diabetes mellitus in pregnancy, second trimester: Secondary | ICD-10-CM

## 2013-12-04 DIAGNOSIS — O358XX0 Maternal care for other (suspected) fetal abnormality and damage, not applicable or unspecified: Secondary | ICD-10-CM | POA: Insufficient documentation

## 2013-12-04 DIAGNOSIS — O351XX1 Maternal care for (suspected) chromosomal abnormality in fetus, fetus 1: Secondary | ICD-10-CM

## 2013-12-04 DIAGNOSIS — O2692 Pregnancy related conditions, unspecified, second trimester: Secondary | ICD-10-CM

## 2013-12-04 DIAGNOSIS — O3510X0 Maternal care for (suspected) chromosomal abnormality in fetus, unspecified, not applicable or unspecified: Secondary | ICD-10-CM | POA: Insufficient documentation

## 2013-12-04 DIAGNOSIS — O24919 Unspecified diabetes mellitus in pregnancy, unspecified trimester: Secondary | ICD-10-CM | POA: Insufficient documentation

## 2013-12-05 ENCOUNTER — Ambulatory Visit (HOSPITAL_COMMUNITY)
Admission: RE | Admit: 2013-12-05 | Discharge: 2013-12-05 | Disposition: A | Discharge status: Home / Self Care | Payer: 59 | Source: Ambulatory Visit | Attending: Family Medicine | Admitting: Family Medicine

## 2013-12-05 ENCOUNTER — Other Ambulatory Visit (HOSPITAL_COMMUNITY): Payer: Self-pay | Admitting: Obstetrics and Gynecology

## 2013-12-05 ENCOUNTER — Ambulatory Visit (HOSPITAL_COMMUNITY): Payer: 59

## 2013-12-05 DIAGNOSIS — O351XX1 Maternal care for (suspected) chromosomal abnormality in fetus, fetus 1: Secondary | ICD-10-CM

## 2013-12-05 DIAGNOSIS — E109 Type 1 diabetes mellitus without complications: Secondary | ICD-10-CM | POA: Diagnosis present

## 2013-12-05 DIAGNOSIS — O351XX0 Maternal care for (suspected) chromosomal abnormality in fetus, not applicable or unspecified: Secondary | ICD-10-CM | POA: Diagnosis present

## 2013-12-05 DIAGNOSIS — O24912 Unspecified diabetes mellitus in pregnancy, second trimester: Secondary | ICD-10-CM

## 2013-12-05 DIAGNOSIS — O2692 Pregnancy related conditions, unspecified, second trimester: Secondary | ICD-10-CM

## 2013-12-05 DIAGNOSIS — O24919 Unspecified diabetes mellitus in pregnancy, unspecified trimester: Secondary | ICD-10-CM | POA: Insufficient documentation

## 2013-12-05 DIAGNOSIS — O3510X0 Maternal care for (suspected) chromosomal abnormality in fetus, unspecified, not applicable or unspecified: Secondary | ICD-10-CM | POA: Insufficient documentation

## 2013-12-05 DIAGNOSIS — O409XX0 Polyhydramnios, unspecified trimester, not applicable or unspecified: Secondary | ICD-10-CM | POA: Insufficient documentation

## 2013-12-05 DIAGNOSIS — O358XX0 Maternal care for other (suspected) fetal abnormality and damage, not applicable or unspecified: Secondary | ICD-10-CM | POA: Insufficient documentation

## 2013-12-05 DIAGNOSIS — O2432 Unspecified pre-existing diabetes mellitus in childbirth: Principal | ICD-10-CM | POA: Diagnosis present

## 2013-12-05 DIAGNOSIS — Z794 Long term (current) use of insulin: Secondary | ICD-10-CM

## 2013-12-05 NOTE — Progress Notes (Signed)
[name]  was seen today for an ultrasound appointment.  See full report in [facility].  Impression: Single IUP at 28 5/7 weeks Multiple fetal anomalies, Fetal growth restriction,Trisomy 18 by NIPS UA Dopplers - absent diastolic flow, intermittend reversed flow (unchanged from yesterday's exam) Active fetus with BPP of 8/8 Polyhydramnios with MVP of 8.7 cm.  [name] wants wants intervention on fetal behalf to avoid stillbirth.  In a fetus with a normal karyotype, would recommend admission for continuous monitoring and daily testing - but in a fetus with Trisomy 18, difficult to determine the significance of abnormal UA Dopplers - particularly in light of a normal BPP.  The patient would like to avoid hospitalization and appears to understand that there is a risk of IUFD regardless of the results of antepartum fetal testing.    Recommendations: Will continue daily BPPs +/- UA Dopplers at least through the end of the week.  Jennifer Gula, MD

## 2013-12-06 ENCOUNTER — Encounter (HOSPITAL_COMMUNITY): Payer: Self-pay

## 2013-12-06 ENCOUNTER — Inpatient Hospital Stay (HOSPITAL_COMMUNITY)
Admission: RE | Admit: 2013-12-06 | Discharge: 2013-12-09 | DRG: 774 | Disposition: A | Discharge status: Home / Self Care | Payer: 59 | Source: Ambulatory Visit | Attending: Obstetrics and Gynecology | Admitting: Obstetrics and Gynecology

## 2013-12-06 VITALS — BP 118/80 | HR 99 | Temp 98.0°F | Resp 18 | Ht 62.0 in | Wt 135.0 lb

## 2013-12-06 DIAGNOSIS — O359XX0 Maternal care for (suspected) fetal abnormality and damage, unspecified, not applicable or unspecified: Secondary | ICD-10-CM | POA: Diagnosis present

## 2013-12-06 DIAGNOSIS — O359XX1 Maternal care for (suspected) fetal abnormality and damage, unspecified, fetus 1: Secondary | ICD-10-CM

## 2013-12-06 DIAGNOSIS — O2692 Pregnancy related conditions, unspecified, second trimester: Secondary | ICD-10-CM

## 2013-12-06 DIAGNOSIS — Z3490 Encounter for supervision of normal pregnancy, unspecified, unspecified trimester: Secondary | ICD-10-CM

## 2013-12-06 LAB — GLUCOSE, CAPILLARY
GLUCOSE-CAPILLARY: 70 mg/dL (ref 70–99)
Glucose-Capillary: 132 mg/dL — ABNORMAL HIGH (ref 70–99)

## 2013-12-06 LAB — CBC
HEMATOCRIT: 39.8 % (ref 36.0–46.0)
Hemoglobin: 13.9 g/dL (ref 12.0–15.0)
MCH: 32.3 pg (ref 26.0–34.0)
MCHC: 34.9 g/dL (ref 30.0–36.0)
MCV: 92.6 fL (ref 78.0–100.0)
Platelets: 255 10*3/uL (ref 150–400)
RBC: 4.3 MIL/uL (ref 3.87–5.11)
RDW: 12.1 % (ref 11.5–15.5)
WBC: 11.1 10*3/uL — ABNORMAL HIGH (ref 4.0–10.5)

## 2013-12-06 MED ORDER — ZOLPIDEM TARTRATE 5 MG PO TABS: 5.0000 mg | ORAL_TABLET | Freq: Every evening | ORAL | Status: DC | PRN

## 2013-12-06 MED ORDER — DOCUSATE SODIUM 100 MG PO CAPS: 100.0000 mg | ORAL_CAPSULE | Freq: Every day | ORAL | Status: DC

## 2013-12-06 MED ORDER — INSULIN DETEMIR 100 UNIT/ML ~~LOC~~ SOLN
17.0000 [IU] | Freq: Every day | SUBCUTANEOUS | Status: DC
  Administered 2013-12-09: 17 [IU] via SUBCUTANEOUS
  Filled 2013-12-06 (×4): qty 0.17

## 2013-12-06 MED ORDER — INSULIN ASPART 100 UNIT/ML ~~LOC~~ SOLN
1.0000 [IU] | Freq: Three times a day (TID) | SUBCUTANEOUS | Status: DC
  Administered 2013-12-07: 9 [IU] via SUBCUTANEOUS
  Administered 2013-12-08: 10 [IU] via SUBCUTANEOUS
  Administered 2013-12-08: 3 [IU] via SUBCUTANEOUS

## 2013-12-06 MED ORDER — PRENATAL MULTIVITAMIN CH
1.0000 | ORAL_TABLET | Freq: Every day | ORAL | Status: DC
  Administered 2013-12-07: 1 via ORAL
  Filled 2013-12-06: qty 1

## 2013-12-06 MED ORDER — CALCIUM CARBONATE ANTACID 500 MG PO CHEW
2.0000 | CHEWABLE_TABLET | ORAL | Status: DC | PRN
Start: 2013-12-06 — End: 2013-12-08

## 2013-12-06 MED ORDER — INSULIN DETEMIR 100 UNIT/ML ~~LOC~~ SOLN
6.0000 [IU] | Freq: Every day | SUBCUTANEOUS | Status: DC
  Administered 2013-12-06 – 2013-12-08 (×2): 6 [IU] via SUBCUTANEOUS
  Filled 2013-12-06 (×3): qty 0.06

## 2013-12-06 MED ORDER — ACETAMINOPHEN 325 MG PO TABS: 650.0000 mg | ORAL_TABLET | ORAL | Status: DC | PRN

## 2013-12-06 NOTE — H&P (Signed)
[name] [name] is a 23 y.o. female, G1P0, EGA 9977W6D with EDC 3-20 presenting for monitoring.  This pregnancy with known suspected Trisomy 5018 with multiple fetal anomalies, followed by Dr. Senaida Mckinney and MFM.  On ultrasound today, there is consistent reverse diastolic flow on UA dopplers, BPP 6/8.  The patient desires to be admitted for fetal monitoring.  She also has Type I DM, followed by [name].  History OB History   Grav Para Term Preterm Abortions TAB SAB Ect Mult Living   1         0     Past Medical History  Diagnosis Date  . Type 1 diabetes mellitus in patient age 21-19 years with HbA1C goal below 7.5   . Hypoglycemia associated with diabetes   . Goiter   . Diabetic peripheral neuropathy associated with type 1 diabetes mellitus   . Type 1 diabetes mellitus with diabetic autonomic neuropathy   . Diabetic peripheral angiopathy   . Tachycardia   . Febrile seizures     Seizures occurred at about 5718/-720 months of age.  . Diabetes mellitus type I   . HSV-1 infection    Past Surgical History  Procedure Laterality Date  . Renal biopsy at age 89    . Indecision and drainage of staphylococcal facial abscess    . Abcess drainage     Family History: family history includes Alcohol abuse in her father and paternal grandfather; Drug abuse in her father; Heart disease in her maternal grandmother and paternal grandfather; Hyperlipidemia in her maternal grandfather, maternal grandmother, paternal grandfather, and paternal grandmother; Hypertension in her father. Social History:  reports that she has never smoked. She has never used smokeless tobacco. She reports that she does not drink alcohol or use illicit drugs.   Prenatal Transfer Tool  Maternal Diabetes: Yes:  Diabetes Type:  Insulin/Medication controlled Genetic Screening: Abnormal:  Results: Trisomy 18 Maternal Ultrasounds/Referrals: Abnormal:  Findings:   IUGR, Fetal Heart Anomalies Fetal Ultrasounds or other Referrals:  Fetal  echo, Referred to Materal Fetal Medicine  Maternal Substance Abuse:  No Significant Maternal Medications:  Meds include: Other:  Significant Maternal Lab Results:  None Other Comments:  None  Review of Systems  Respiratory: Negative.   Cardiovascular: Negative.       Blood pressure 123/90, pulse 85, temperature 98.1 F (36.7 C), temperature source Oral, resp. rate 18, height 5\' 2"  (1.575 m), weight 61.236 kg (135 lb), last menstrual period 05/08/2013. Exam Physical Exam  Constitutional: She appears well-developed and well-nourished.  GI: Soft.  gravid     Assessment/Plan: IUP at 28+ weeks with Trisomy 18 and multiple fetal anomalies with reverse diastolic flow on UA doppler today and BPP 6/8.  The patient wants to be admitted for fetal monitoring, she wants to deliver the baby alive so may want a c-section for fetal distress.  I have notified Dr. Senaida Mckinney and she has communicated with the nurses to be notified for fetal distress.     [name] D 12/06/2013, 1:13 PM

## 2013-12-06 NOTE — Progress Notes (Signed)
Patient ID: Jennifer JainMeridith P Mckinney, female   DOB: [birth year], 23 y.o.   MRN: [id number] Called in to patient for long discussion about her expectations for intervention on behalf of her baby.  I d/w her I am not willing to put her in any danger to perform a c-section given the baby's lethal prognosis.  We specifically discussed that we would not perform a stat c-section that would require general anesthesia and could be a big risk to her if not NPO.  She understands that if the baby were to suddenly crash that I could not likely intervene in a timely enough fashion to safely deliver a live baby and is willing to let the baby succumb in that situation.  Therefore we will forego continuous monitoring and just check FHT's q shift.  She knows that she could have a demise in the intervals of no monitoring. I have advised her that I would not recommend any emergency intervention for her preterm baby and that I would encourage her to accept that she may have a fetal demise in the next few weeks, but to d/c daily monitoring and dopplers and not intervene with a c-section that could likely result in a classical scar and a negative impact on her health at this early gestational age.  She is considering this and we will see what studies show tomorrow for her to decide.

## 2013-12-06 NOTE — Consult Note (Signed)
  Neonatology Consult:   Asked by Dr. Willis Modena to provide prenatal consultation for patient whose prenatal studies show fetus with Trisomy 18 and multiple anomalies, including congenital heart disease (probable pulmonary atresia or stenosis).  I have met which mother and family as an outpatient and she has also meet with Dr. Barbaraann Rondo on 12/17.  She was re-admitted tonight because of Doppler flow studies showing reverse end-diastolic flow, and mother has requested C/section if needed to allow infant to be born alive.  BPP was 6/8 and there are no immediate plans for intervention, but fetus will be monitored per the mother's request.  Spoke with patient, and her mother and father and a sister were also present.  She has also spoken with Engineer, petroleum.  She maintains that she does not want life-prolonging intervention for infant after birth however per her nurse had requested oxygen support and gavage feeds.  We discussed that at this gestational age (23 weeks) that respiratory distress can be significant and that pulmonary atresia could complicate the immediate course.  We discussed that oxygen therapy and gavage feeds would not be able to be provided in the room and that these would constitute intensive measures.  We focused on discussing comfort care with infant being allowed to be with her for skin-to-skin contact and provision of non-intensive environment as much as possible.  She seemed appreciative of our involvement and in agreement with the current plan.  Thank you for allowing [name] to participate in her care.  Please call with questions.  Higinio Roger, DO  Neonatologist  Face-to-face or floor / unit time for this encounter was 20 minutes.  Counseling and / or coordination of care was greater than fifty percent of the time.

## 2013-12-06 NOTE — L&D Delivery Note (Signed)
Delivery Note Pt progressed to complete dilation and pushed great. At 9:28 AM a viable female was delivered via Vaginal, Spontaneous Delivery (Presentation:OA).  APGAR: 2, 2; weight pending.   Placenta status: Intact, Spontaneous.  Cord:  with the following complications: Short.  As had been planned with the patient and her family, the baby was laid immediately on her chest and she held him with comfort measures only. HR shortly after delivery was 60.  Baby kept a heart rate for about 30 minutes after delivery, then expired in mother's arms with family present.  Anesthesia: Epidural  Episiotomy: None Lacerations: None Suture Repair: n/a Est. Blood Loss (mL): 350 cc  Mom to postpartum.  [facility] family has adequate time with him.  Oliver PilaRICHARDSON,Blenda Wisecup W 12/08/2013, 10:42 AM

## 2013-12-07 ENCOUNTER — Inpatient Hospital Stay (HOSPITAL_COMMUNITY)
Admission: RE | Admit: 2013-12-07 | Discharge: 2013-12-07 | Disposition: A | Discharge status: Home / Self Care | Payer: 59 | Source: Ambulatory Visit | Attending: Obstetrics and Gynecology | Admitting: Obstetrics and Gynecology

## 2013-12-07 DIAGNOSIS — Z3490 Encounter for supervision of normal pregnancy, unspecified, unspecified trimester: Secondary | ICD-10-CM

## 2013-12-07 DIAGNOSIS — O2692 Pregnancy related conditions, unspecified, second trimester: Secondary | ICD-10-CM

## 2013-12-07 LAB — GLUCOSE, CAPILLARY
GLUCOSE-CAPILLARY: 115 mg/dL — AB (ref 70–99)
GLUCOSE-CAPILLARY: 282 mg/dL — AB (ref 70–99)
GLUCOSE-CAPILLARY: 45 mg/dL — AB (ref 70–99)
Glucose-Capillary: 131 mg/dL — ABNORMAL HIGH (ref 70–99)
Glucose-Capillary: 177 mg/dL — ABNORMAL HIGH (ref 70–99)
Glucose-Capillary: 199 mg/dL — ABNORMAL HIGH (ref 70–99)
Glucose-Capillary: 249 mg/dL — ABNORMAL HIGH (ref 70–99)
Glucose-Capillary: 289 mg/dL — ABNORMAL HIGH (ref 70–99)
Glucose-Capillary: 62 mg/dL — ABNORMAL LOW (ref 70–99)
Glucose-Capillary: 94 mg/dL (ref 70–99)

## 2013-12-07 LAB — COMPREHENSIVE METABOLIC PANEL
ALBUMIN: 2.7 g/dL — AB (ref 3.5–5.2)
ALT: 19 U/L (ref 0–35)
AST: 15 U/L (ref 0–37)
Alkaline Phosphatase: 67 U/L (ref 39–117)
BUN: 13 mg/dL (ref 6–23)
CO2: 19 mEq/L (ref 19–32)
CREATININE: 0.57 mg/dL (ref 0.50–1.10)
Calcium: 9 mg/dL (ref 8.4–10.5)
Chloride: 97 mEq/L (ref 96–112)
GFR calc Af Amer: >90 mL/min (ref >90)
GFR calc non Af Amer: >90 mL/min (ref >90)
Glucose, Bld: 266 mg/dL — ABNORMAL HIGH (ref 70–99)
POTASSIUM: 4.1 meq/L (ref 3.7–5.3)
Sodium: 135 mEq/L — ABNORMAL LOW (ref 137–147)
TOTAL PROTEIN: 6.5 g/dL (ref 6.0–8.3)
Total Bilirubin: <0.2 mg/dL — ABNORMAL LOW (ref 0.3–1.2)

## 2013-12-07 LAB — CBC
HEMATOCRIT: 39.9 % (ref 36.0–46.0)
Hemoglobin: 14 g/dL (ref 12.0–15.0)
MCH: 32.6 pg (ref 26.0–34.0)
MCHC: 35.1 g/dL (ref 30.0–36.0)
MCV: 93 fL (ref 78.0–100.0)
Platelets: 238 10*3/uL (ref 150–400)
RBC: 4.29 MIL/uL (ref 3.87–5.11)
RDW: 12.2 % (ref 11.5–15.5)
WBC: 15.4 10*3/uL — ABNORMAL HIGH (ref 4.0–10.5)

## 2013-12-07 LAB — URIC ACID: URIC ACID, SERUM: 6.6 mg/dL (ref 2.4–7.0)

## 2013-12-07 LAB — LACTATE DEHYDROGENASE: LDH: 220 U/L (ref 94–250)

## 2013-12-07 LAB — RPR: RPR Ser Ql: NONREACTIVE

## 2013-12-07 MED ORDER — ONDANSETRON HCL 4 MG/2ML IJ SOLN
4.0000 mg | Freq: Four times a day (QID) | INTRAMUSCULAR | Status: DC | PRN
  Administered 2013-12-08: 4 mg via INTRAVENOUS
  Filled 2013-12-07: qty 2

## 2013-12-07 MED ORDER — FLEET ENEMA 7-19 GM/118ML RE ENEM: 1.0000 | ENEMA | RECTAL | Status: DC | PRN

## 2013-12-07 MED ORDER — MISOPROSTOL 25 MCG QUARTER TABLET
25.0000 ug | ORAL_TABLET | ORAL | Status: DC | PRN
Start: 2013-12-07 — End: 2013-12-08
  Administered 2013-12-07: 25 ug via VAGINAL
  Filled 2013-12-07: qty 0.25

## 2013-12-07 MED ORDER — PHENYLEPHRINE 40 MCG/ML (10ML) SYRINGE FOR IV PUSH (FOR BLOOD PRESSURE SUPPORT): 80.0000 ug | PREFILLED_SYRINGE | INTRAVENOUS | Status: DC | PRN

## 2013-12-07 MED ORDER — LACTATED RINGERS IV SOLN: 500.0000 mL | INTRAVENOUS | Status: DC | PRN

## 2013-12-07 MED ORDER — IBUPROFEN 600 MG PO TABS
600.0000 mg | ORAL_TABLET | Freq: Four times a day (QID) | ORAL | Status: DC | PRN
  Administered 2013-12-08: 600 mg via ORAL
  Filled 2013-12-07: qty 1

## 2013-12-07 MED ORDER — OXYTOCIN 40 UNITS IN LACTATED RINGERS INFUSION - SIMPLE MED
62.5000 mL/h | INTRAVENOUS | Status: DC
Start: 2013-12-07 — End: 2013-12-08

## 2013-12-07 MED ORDER — DEXTROSE IN LACTATED RINGERS 5 % IV SOLN
INTRAVENOUS | Status: DC
  Administered 2013-12-08: 08:00:00 via INTRAVENOUS

## 2013-12-07 MED ORDER — LACTATED RINGERS IV SOLN
500.0000 mL | Freq: Once | INTRAVENOUS | Status: AC
  Administered 2013-12-08: 500 mL via INTRAVENOUS

## 2013-12-07 MED ORDER — CITRIC ACID-SODIUM CITRATE 334-500 MG/5ML PO SOLN: 30.0000 mL | ORAL | Status: DC | PRN

## 2013-12-07 MED ORDER — TERBUTALINE SULFATE 1 MG/ML IJ SOLN: 0.2500 mg | Freq: Once | INTRAMUSCULAR | Status: AC | PRN

## 2013-12-07 MED ORDER — EPHEDRINE 5 MG/ML INJ
10.0000 mg | INTRAVENOUS | Status: DC | PRN
  Filled 2013-12-07: qty 4

## 2013-12-07 MED ORDER — ACETAMINOPHEN 325 MG PO TABS: 650.0000 mg | ORAL_TABLET | ORAL | Status: DC | PRN

## 2013-12-07 MED ORDER — OXYCODONE-ACETAMINOPHEN 5-325 MG PO TABS: 1.0000 | ORAL_TABLET | ORAL | Status: DC | PRN

## 2013-12-07 MED ORDER — EPHEDRINE 5 MG/ML INJ: 10.0000 mg | INTRAVENOUS | Status: DC | PRN

## 2013-12-07 MED ORDER — SODIUM CHLORIDE 0.9 % IV SOLN
INTRAVENOUS | Status: DC
  Administered 2013-12-07: 0.3 [IU]/h via INTRAVENOUS
  Administered 2013-12-08: 0.1 [IU]/h via INTRAVENOUS
  Filled 2013-12-07: qty 1

## 2013-12-07 MED ORDER — FENTANYL 2.5 MCG/ML BUPIVACAINE 1/10 % EPIDURAL INFUSION (WH - ANES)
14.0000 mL/h | INTRAMUSCULAR | Status: DC | PRN
  Filled 2013-12-07: qty 125

## 2013-12-07 MED ORDER — PHENYLEPHRINE 40 MCG/ML (10ML) SYRINGE FOR IV PUSH (FOR BLOOD PRESSURE SUPPORT)
80.0000 ug | PREFILLED_SYRINGE | INTRAVENOUS | Status: DC | PRN
  Filled 2013-12-07: qty 10

## 2013-12-07 MED ORDER — LIDOCAINE HCL (PF) 1 % IJ SOLN: 30.0000 mL | INTRAMUSCULAR | Status: DC | PRN

## 2013-12-07 MED ORDER — OXYTOCIN BOLUS FROM INFUSION: 500.0000 mL | INTRAVENOUS | Status: DC

## 2013-12-07 MED ORDER — LACTATED RINGERS IV SOLN: INTRAVENOUS | Status: DC

## 2013-12-07 MED ORDER — DIPHENHYDRAMINE HCL 50 MG/ML IJ SOLN: 12.5000 mg | INTRAMUSCULAR | Status: DC | PRN

## 2013-12-07 MED ORDER — BUTORPHANOL TARTRATE 1 MG/ML IJ SOLN
1.0000 mg | INTRAMUSCULAR | Status: DC | PRN
  Administered 2013-12-07: 1 mg via INTRAVENOUS
  Filled 2013-12-07: qty 1

## 2013-12-07 NOTE — Progress Notes (Signed)
Dr Senaida Oresichardson called and RN gave an update on UC pattern and inability to place cytotec at 2230 if UCs continue in current pattern.  Orders given to wait and place cytotec when possible; if not able to place cytotec by 0030, start pitocin 2X2 at 0030.  MD also updated on last 3 CBG readings.  Orders given to continue to run LR as maintenance until CBG is below 100 at which point RN will hang D5 as maintenance fluid.

## 2013-12-07 NOTE — Progress Notes (Signed)
Patient ID: Jennifer JainMeridith P Link, female   DOB: [birth year], 23 y.o.   MRN: [id number] Pt seen with MFM this AM and Dopplers still show reversal of flow and BPP still 6/8.  Monitoring has intermittent late decels.  Pt with some hypertension now BP 130/100.  Will check labs and UA but doubt preeclampsia. D/w pt again in great detail my concerns for putting her through a surgery and probable classical scar for her baby with a lethal anomaly that can only survive minutes at most without aggressive intervention at 29 weeks.  Pt does not want aggressive intervention for her baby since he has lethal anomalies.    It seems that we need to move to delivery given the increasing BP but I favor induction of labor even though this likely would mean an intrauterine fetal death.  After careful consideration the patient have elected to proceed with induction of labor with intermittent FHR checks only, just so she may know if the baby dies prior to birth--but would not intervene with a c-section on the grounds of fetal distress since the baby has a lethal prognosis.  We discussed pain management, cytotec + foley bulb, and pitocin.  We also discussed going on the glucose stabilizer while in the induction process.  If the baby is born alive, NICU may attend for comfort measures only, no heroic or resuscitative efforts will be performed.  The patient is agreeable to this plan.  Will move to L&D and begin cytotec after she eats as she has not eaten all day and has Type 1 DM.

## 2013-12-07 NOTE — Progress Notes (Signed)
Patient ID: Jennifer JainMeridith P Mckinney, female   DOB: 10/23/1991, 23 y.o.   MRN: [id number] Pt moved over to L&D.  BP stable but elevated at 130-140/90-100  PIH labs WNL BS improved to 90's but just ate.   Glucose stabilizer started.  Cervix 30/1/ballotable vertex  [facility]otec placed [facility]just to know" but no intervention for fetal distress. Plan cytotec tonight and then pitocin and AROM in AM if does not progress.  Pt and family aware of plan and in agreement

## 2013-12-08 ENCOUNTER — Encounter (HOSPITAL_COMMUNITY): Payer: 59 | Admitting: Anesthesiology

## 2013-12-08 ENCOUNTER — Inpatient Hospital Stay (HOSPITAL_COMMUNITY): Payer: 59 | Admitting: Anesthesiology

## 2013-12-08 ENCOUNTER — Encounter (HOSPITAL_COMMUNITY): Payer: Self-pay

## 2013-12-08 LAB — GLUCOSE, CAPILLARY
GLUCOSE-CAPILLARY: 113 mg/dL — AB (ref 70–99)
GLUCOSE-CAPILLARY: 114 mg/dL — AB (ref 70–99)
GLUCOSE-CAPILLARY: 119 mg/dL — AB (ref 70–99)
GLUCOSE-CAPILLARY: 120 mg/dL — AB (ref 70–99)
GLUCOSE-CAPILLARY: 71 mg/dL (ref 70–99)
GLUCOSE-CAPILLARY: 95 mg/dL (ref 70–99)
Glucose-Capillary: 102 mg/dL — ABNORMAL HIGH (ref 70–99)
Glucose-Capillary: 97 mg/dL (ref 70–99)
Glucose-Capillary: 108 mg/dL — ABNORMAL HIGH (ref 70–99)
Glucose-Capillary: 107 mg/dL — ABNORMAL HIGH (ref 70–99)
Glucose-Capillary: 73 mg/dL (ref 70–99)
Glucose-Capillary: 78 mg/dL (ref 70–99)

## 2013-12-08 LAB — CBC
HEMATOCRIT: 37.5 % (ref 36.0–46.0)
HEMOGLOBIN: 13.8 g/dL (ref 12.0–15.0)
MCH: 33.3 pg (ref 26.0–34.0)
MCHC: 36.8 g/dL — ABNORMAL HIGH (ref 30.0–36.0)
MCV: 90.6 fL (ref 78.0–100.0)
Platelets: 230 10*3/uL (ref 150–400)
RBC: 4.14 MIL/uL (ref 3.87–5.11)
RDW: 12.1 % (ref 11.5–15.5)
WBC: 24.4 10*3/uL — AB (ref 4.0–10.5)

## 2013-12-08 MED ORDER — WITCH HAZEL-GLYCERIN EX PADS: 1.0000 "application " | MEDICATED_PAD | CUTANEOUS | Status: DC | PRN

## 2013-12-08 MED ORDER — BENZOCAINE-MENTHOL 20-0.5 % EX AERO
1.0000 "application " | INHALATION_SPRAY | CUTANEOUS | Status: DC | PRN
  Filled 2013-12-08: qty 56

## 2013-12-08 MED ORDER — FENTANYL CITRATE 0.05 MG/ML IJ SOLN
INTRAMUSCULAR | Status: AC
  Filled 2013-12-08: qty 2

## 2013-12-08 MED ORDER — SENNOSIDES-DOCUSATE SODIUM 8.6-50 MG PO TABS
2.0000 | ORAL_TABLET | ORAL | Status: DC
  Administered 2013-12-08: 2 via ORAL
  Filled 2013-12-08: qty 2

## 2013-12-08 MED ORDER — FENTANYL CITRATE 0.05 MG/ML IJ SOLN
INTRAMUSCULAR | Status: DC | PRN
  Administered 2013-12-08: 100 ug via EPIDURAL

## 2013-12-08 MED ORDER — ONDANSETRON HCL 4 MG PO TABS: 4.0000 mg | ORAL_TABLET | ORAL | Status: DC | PRN

## 2013-12-08 MED ORDER — LANOLIN HYDROUS EX OINT: TOPICAL_OINTMENT | CUTANEOUS | Status: DC | PRN

## 2013-12-08 MED ORDER — OXYTOCIN 40 UNITS IN LACTATED RINGERS INFUSION - SIMPLE MED
1.0000 m[IU]/min | INTRAVENOUS | Status: DC
  Administered 2013-12-08: 2 m[IU]/min via INTRAVENOUS
  Filled 2013-12-08: qty 1000

## 2013-12-08 MED ORDER — OXYCODONE-ACETAMINOPHEN 5-325 MG PO TABS: 1.0000 | ORAL_TABLET | ORAL | Status: DC | PRN

## 2013-12-08 MED ORDER — PRENATAL MULTIVITAMIN CH: 1.0000 | ORAL_TABLET | Freq: Every day | ORAL | Status: DC

## 2013-12-08 MED ORDER — TETANUS-DIPHTH-ACELL PERTUSSIS 5-2.5-18.5 LF-MCG/0.5 IM SUSP
0.5000 mL | Freq: Once | INTRAMUSCULAR | Status: DC
  Filled 2013-12-08: qty 0.5

## 2013-12-08 MED ORDER — ONDANSETRON HCL 4 MG/2ML IJ SOLN
4.0000 mg | INTRAMUSCULAR | Status: DC | PRN
  Administered 2013-12-08: 4 mg via INTRAVENOUS
  Filled 2013-12-08: qty 2

## 2013-12-08 MED ORDER — SODIUM BICARBONATE 8.4 % IV SOLN
INTRAVENOUS | Status: DC | PRN
  Administered 2013-12-08 (×2): 5 mL via EPIDURAL

## 2013-12-08 MED ORDER — LACTATED RINGERS IV SOLN: INTRAVENOUS | Status: DC

## 2013-12-08 MED ORDER — SIMETHICONE 80 MG PO CHEW: 80.0000 mg | CHEWABLE_TABLET | ORAL | Status: DC | PRN

## 2013-12-08 MED ORDER — DIBUCAINE 1 % RE OINT
1.0000 "application " | TOPICAL_OINTMENT | RECTAL | Status: DC | PRN
  Filled 2013-12-08: qty 28

## 2013-12-08 MED ORDER — FENTANYL 2.5 MCG/ML BUPIVACAINE 1/10 % EPIDURAL INFUSION (WH - ANES)
INTRAMUSCULAR | Status: DC | PRN
  Administered 2013-12-08: 14 mL/h via EPIDURAL

## 2013-12-08 MED ORDER — DIPHENHYDRAMINE HCL 25 MG PO CAPS: 25.0000 mg | ORAL_CAPSULE | Freq: Four times a day (QID) | ORAL | Status: DC | PRN

## 2013-12-08 MED ORDER — IBUPROFEN 600 MG PO TABS
600.0000 mg | ORAL_TABLET | Freq: Four times a day (QID) | ORAL | Status: DC
  Administered 2013-12-08 – 2013-12-09 (×3): 600 mg via ORAL
  Filled 2013-12-08 (×3): qty 1

## 2013-12-08 MED ORDER — ZOLPIDEM TARTRATE 5 MG PO TABS: 5.0000 mg | ORAL_TABLET | Freq: Every evening | ORAL | Status: DC | PRN

## 2013-12-08 NOTE — Anesthesia Procedure Notes (Signed)

## 2013-12-08 NOTE — Anesthesia Preprocedure Evaluation (Signed)
Anesthesia Evaluation  Patient identified by MRN, date of birth, ID band Patient awake    Reviewed: Allergy & Precautions, H&P , Patient's Chart, lab work & pertinent test results  Airway Mallampati: II TM Distance: >3 FB Neck ROM: full    Dental  (+) Teeth Intact   Pulmonary  breath sounds clear to auscultation        Cardiovascular Rhythm:regular Rate:Normal     Neuro/Psych    GI/Hepatic   Endo/Other    Renal/GU      Musculoskeletal   Abdominal   Peds  Hematology   Anesthesia Other Findings  Type 1 diabetes mellitus with Diabetic peripheral angiopathy        Tachycardia      Febrile seizures at about 7618/-300 months of age.          Reproductive/Obstetrics (+) Pregnancy                           Anesthesia Physical Anesthesia Plan  ASA: III  Anesthesia Plan: Epidural   Post-op Pain Management:    Induction:   Airway Management Planned:   Additional Equipment:   Intra-op Plan:   Post-operative Plan:   Informed Consent: I have reviewed the patients History and Physical, chart, labs and discussed the procedure including the risks, benefits and alternatives for the proposed anesthesia with the patient or authorized representative who has indicated his/her understanding and acceptance.   Dental Advisory Given  Plan Discussed with:   Anesthesia Plan Comments: (Labs checked- platelets confirmed with RN in room. Fetal heart tracing, per RN, reported to be stable enough for sitting procedure. Discussed epidural, and patient consents to the procedure:  included risk of possible headache,backache, failed block, allergic reaction, and nerve injury. This patient was asked if she had any questions or concerns before the procedure started.)        Anesthesia Quick Evaluation

## 2013-12-08 NOTE — Progress Notes (Signed)
Patient ID: Jennifer JainMeridith P Link, female   DOB: 1991-08-27, 23 y.o.   MRN: [id number] Pt had good response to one dose of cytotec and low dose ptiocin  She is comfortable with an epidural, but having some N/V  She has elected no monitoring of the fetal heart beat, and is ok if baby is a stillborn, which is very likely  Cervix c/5/-1  AROM of forebag  Will follow progress. BS well controlled on glucose stabilizer

## 2013-12-08 NOTE — Progress Notes (Signed)
CBG= 121, Levemir 6 units SQ given.

## 2013-12-08 NOTE — Progress Notes (Signed)
Dr Senaida Oresichardson updated on UC pattern.  RN also updated MD on pt BP and comfort level with epidural.  No new orders given.  RN will leave pitocin at current level.

## 2013-12-08 NOTE — Anesthesia Postprocedure Evaluation (Signed)
Anesthesia Post Note  Patient: [name]  Procedure(s) Performed: * No procedures listed *  Anesthesia type: Epidural  Patient location: [facility] Unit  Post pain: Pain level controlled  Post assessment: Post-op Vital signs reviewed  Last Vitals:  Filed Vitals:   12/08/13 1351  BP: 125/81  Pulse: 101  Temp: 36.7 C  Resp: 18    Post vital signs: Reviewed  Level of consciousness:alert  Complications: No apparent anesthesia complications

## 2013-12-09 LAB — CBC
HCT: 35.2 % — ABNORMAL LOW (ref 36.0–46.0)
Hemoglobin: 12 g/dL (ref 12.0–15.0)
MCH: 31.7 pg (ref 26.0–34.0)
MCHC: 34.1 g/dL (ref 30.0–36.0)
MCV: 93.1 fL (ref 78.0–100.0)
PLATELETS: 217 10*3/uL (ref 150–400)
RBC: 3.78 MIL/uL — ABNORMAL LOW (ref 3.87–5.11)
RDW: 12.2 % (ref 11.5–15.5)
WBC: 15.6 10*3/uL — ABNORMAL HIGH (ref 4.0–10.5)

## 2013-12-09 MED ORDER — IBUPROFEN 600 MG PO TABS: 600.0000 mg | ORAL_TABLET | Freq: Four times a day (QID) | ORAL | Status: DC

## 2013-12-09 NOTE — Progress Notes (Signed)
Discharge instructions reviewed with patient and family.  Patient states understanding of home care, medications, activity, signs/symptoms to seek help for and report to MD and return MD office visit.  No home equipment needed.  Patient ambulated for discharge in stable condition with staff without incident.

## 2013-12-09 NOTE — Progress Notes (Signed)
Fasting CBG= 110

## 2013-12-09 NOTE — Progress Notes (Signed)
Patient ID: Perley JainMeridith P Mckinney, female   DOB: [birth year], 23 y.o.   MRN: [id number] Pt coping well.  Spent the whole day and night with baby and funeral home picking him up today.   BS adjusting on her levamir and she is eating better. Bleeding minimal BP stable  Plan d/c home today. F/u office in 2-3 weeks

## 2013-12-09 NOTE — Discharge Summary (Signed)
Obstetric Discharge Summary Reason for Admission: non-reassuring fetal testing and maternal hypertension Prenatal Procedures: NST and ultrasound Intrapartum Procedures: spontaneous vaginal delivery and neonatal death from know trisomy 18 Postpartum Procedures: none Complications-Operative and Postpartum: none Hemoglobin  Date Value Range Status  12/09/2013 12.0  12.0 - 15.0 g/dL Final     HCT  Date Value Range Status  12/09/2013 35.2* 36.0 - 46.0 % Final    Physical Exam:  General: alert and cooperative Lochia: appropriate Uterine Fundus: firm   Discharge Diagnoses: Preterm pregnancy delivered by induction                                         Baby with Trisomy 18 and neonatal death                                          Discharge Information: Date: 12/09/2013 Activity: pelvic rest Diet: routine Medications: Ibuprofen and insulin as pre-delivery Condition: improved Instructions: refer to practice specific booklet Discharge to: home Follow-up Information   Follow up with Oliver PilaICHARDSON,Kesa Birky W, MD. Schedule an appointment as soon as possible for a visit in 3 weeks. (postpartum check)    Specialty:  Obstetrics and Gynecology   Contact information:   [address], [address] [place] [id number] [phone]       Newborn Data: Live born female  Birth Weight: 1 lb 11.5 oz (780 g) APGAR: 2, 2  Baby expired after 30 minutes of life with comfort measures only due to the known trisomy 18 anomaly  Jaclyn Carew W 12/09/2013, 10:45 AM

## 2013-12-10 ENCOUNTER — Ambulatory Visit (HOSPITAL_COMMUNITY): Payer: 59

## 2013-12-10 LAB — TYPE AND SCREEN
ABO/RH(D): B NEG
Antibody Screen: POSITIVE
DAT, IgG: NEGATIVE
UNIT DIVISION: 0
Unit division: 0

## 2013-12-10 LAB — GLUCOSE, CAPILLARY
GLUCOSE-CAPILLARY: 212 mg/dL — AB (ref 70–99)
Glucose-Capillary: 110 mg/dL — ABNORMAL HIGH (ref 70–99)
Glucose-Capillary: 121 mg/dL — ABNORMAL HIGH (ref 70–99)
Glucose-Capillary: 141 mg/dL — ABNORMAL HIGH (ref 70–99)
Glucose-Capillary: 190 mg/dL — ABNORMAL HIGH (ref 70–99)
Glucose-Capillary: 239 mg/dL — ABNORMAL HIGH (ref 70–99)

## 2013-12-11 ENCOUNTER — Inpatient Hospital Stay (HOSPITAL_COMMUNITY): Payer: 59

## 2013-12-11 ENCOUNTER — Ambulatory Visit (HOSPITAL_COMMUNITY): Payer: 59

## 2013-12-12 ENCOUNTER — Ambulatory Visit (HOSPITAL_COMMUNITY): Payer: 59

## 2013-12-13 ENCOUNTER — Ambulatory Visit (HOSPITAL_COMMUNITY): Payer: 59

## 2013-12-14 ENCOUNTER — Ambulatory Visit (HOSPITAL_COMMUNITY): Payer: 59

## 2013-12-17 ENCOUNTER — Ambulatory Visit (HOSPITAL_COMMUNITY): Payer: 59

## 2013-12-18 ENCOUNTER — Ambulatory Visit (HOSPITAL_COMMUNITY): Payer: 59

## 2014-03-01 ENCOUNTER — Ambulatory Visit (HOSPITAL_COMMUNITY)
Admission: RE | Admit: 2014-03-01 | Discharge: 2014-03-01 | Disposition: A | Discharge status: Home / Self Care | Payer: Medicaid Other | Source: Ambulatory Visit | Attending: Obstetrics and Gynecology | Admitting: Obstetrics and Gynecology

## 2014-03-01 DIAGNOSIS — O24919 Unspecified diabetes mellitus in pregnancy, unspecified trimester: Secondary | ICD-10-CM | POA: Insufficient documentation

## 2014-03-01 DIAGNOSIS — O3510X0 Maternal care for (suspected) chromosomal abnormality in fetus, unspecified, not applicable or unspecified: Secondary | ICD-10-CM | POA: Insufficient documentation

## 2014-03-01 DIAGNOSIS — Z794 Long term (current) use of insulin: Secondary | ICD-10-CM | POA: Insufficient documentation

## 2014-03-01 DIAGNOSIS — O351XX0 Maternal care for (suspected) chromosomal abnormality in fetus, not applicable or unspecified: Secondary | ICD-10-CM | POA: Insufficient documentation

## 2014-03-01 DIAGNOSIS — E109 Type 1 diabetes mellitus without complications: Secondary | ICD-10-CM | POA: Insufficient documentation

## 2014-03-01 DIAGNOSIS — IMO0002 Reserved for concepts with insufficient information to code with codable children: Secondary | ICD-10-CM

## 2014-03-01 LAB — PERIPHERAL BLOOD ROUTINE CHROMOSOME - KARYOTYPE

## 2014-03-05 NOTE — ED Notes (Signed)
Genetic Counseling  Visit Summary Note  Appointment Date: 03/01/2014 Referred By: Jennifer Bores, MD  Date of Birth: 11-26-1991  Pregnancy history: G1P0100   I met with [name] for genetic counseling because of a family history of Trisomy 79.  [name] son, [name], was born at 81 weeks and died shortly after birth.  Her sister, [name], was also present during the visit today.  We began by reviewing the family history in detail.  We discussed that her first pregnancy was identified by cell free DNA testing to have evidence of extra chromosome 18 material.  She was counseled that this was most consistent with a diagnosis of Trisomy 18. As the pregnancy progressed, ultrasound findings were also consistent with this diagnosis.  While confirmatory testing was never performed, the diagnosis of Trisomy 18 is the most likely explanation.  She was counseled that Trisomy 18 occurs in approximately 1 per 6000 live births, due to non disjunction during meiosis.  She was counseled that recurrence of Trisomy 18 is rare, with most literature reporting an almost 0% recurrence chance.  However, newer data [name] et al) showed a true increased risk above the age related risk.  Given that the baseline (age-related) risk is so small, even a doubling of this risk, still results in a very small chance.  There may be some women who are at increased risk for meiotic errors in general, compared to other women of the same age, which may account for this small increased risk.  Overall, the chance for her to have a child with an extra chromosome condition is still much less than 1%.   We discussed that as we had not examined the chromosomes of her son with Trisomy 23, we could not state that he did not have a translocation involving chromosome 57, although it was unlikely.  She was counseled that not all translocations are inherited, but a small percentage are.  [name] stated that knowing that she did  not have a chromosome difference would make her more reasurred that her recurrence chance would be low.  She indicated that father of the baby was not involved in the pregnancy with her son, and would not be involved in a future pregnancy.  The family histories were otherwise found to be noncontributory for birth defects, mental retardation, and known genetic conditions. Without further information regarding the provided family history, an accurate genetic risk cannot be calculated. Further genetic counseling is warranted if more information is obtained.  We reviewed the information regarding cystic fibrosis (CF) because [name] was found, during the pregnancy, to be a CF carrier.  She understands that she may wish to have carrier testing performed for the father of a future pregnancy to determine if he is a carrier of CF also.  She understands that when both parents are carriers for CF there is a 1 in 4 chance to have a child with CF.   In addition, we discussed that CF is routinely screened for as part of the [name] newborn screening panel.   [name] has type 1 diabetes.  We discussed the fact that women who have insulin dependent diabetes are at an increased risk to have a baby with a birth defect.  The increase in risk correlates with the level of blood sugar control during the pregnancy, particularly during organogenesis.  The increase in risk is for any type of birth defect but is greatest for heart, limb, and neural tube defects. The risk  could be as high as 6-10% for individuals whose blood sugars are not well-controlled, but lower for women who have good blood sugar control throughout pregnancy.  She understands that getting her A1C lower prior to pregnancy is crucial.  She reports her most recent A1C was over 8.  We also discussed that type 1 diabetes is an autoimmune condition.  She understands that autoimmune conditions are those in which the body produces a disordered immunological response against  itself.  Typically a person's immune mechanism is able to distinguish clearly between what is a normal substance and what is foreign. In autoimmune diseases, this system does not function correctly and it produces antibodies against normal parts of the body and causes disease.  There are several different autoimmune diseases.  While we do know that autoimmune conditions tend to "cluster" within a family, they do not follow a clear pattern of inheritance.  When there is a person in the family with an autoimmune condition, there is an increased chance for others in the family to develop an autoimmune condition, but it may be a different condition.  Specific risk factor information is not available.  Genetic testing is not available at this time to predict who may develop an autoimmune condition.  I counseled [name], in the presence of her sister, regarding the above risks and available options.  She had blood work for maternal karyotype today.  Those results will be available in 10-14 days.  The approximate face-to-face time with the genetic counselor was 60 minutes.  Jennifer Hai, MS Certified Genetic Counselor

## 2014-03-14 ENCOUNTER — Telehealth (HOSPITAL_COMMUNITY): Payer: Self-pay | Admitting: Maternal and Fetal Medicine

## 2014-03-14 NOTE — Telephone Encounter (Signed)
Called Ms. Jennifer [facility] to review the results of her peripheral blood chromosome analysis.  She was did not answer her cell phone so I left a message there.  I also tried her home number and spoke to her mother.  I gave her mother my cell phone number as well so that I could talk to [place] tonight since I know she is anxious. Jennifer Gemmaaragh Conrad, MS Certified Genetic Counselor

## 2014-10-04 IMAGING — US US UA CORD DOPPLER
1 series · 13 of 17 positions shown · non-contrast
Comparison: none

[Series 1: us ua cord doppler · 0.23mm/px · 13 of 17 slices shown]
[im 1/17]
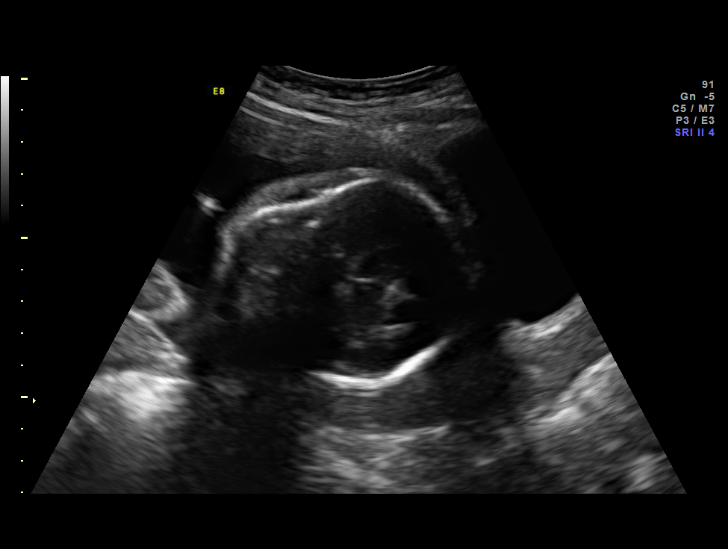
[im 2/17]
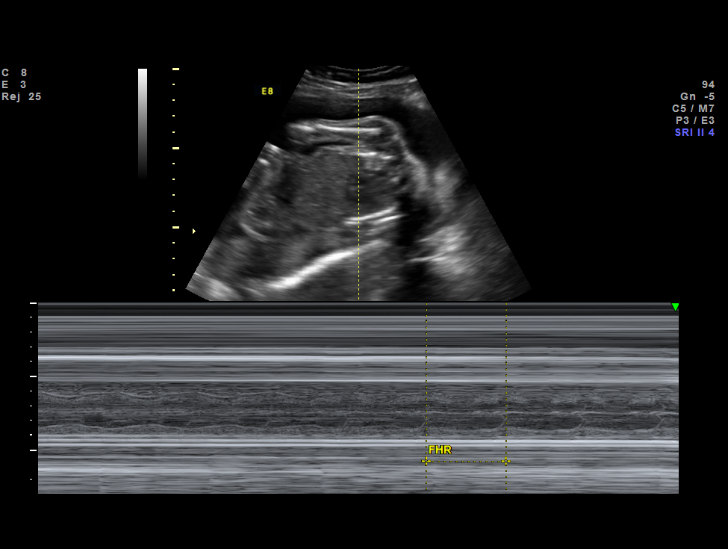
[im 4/17]
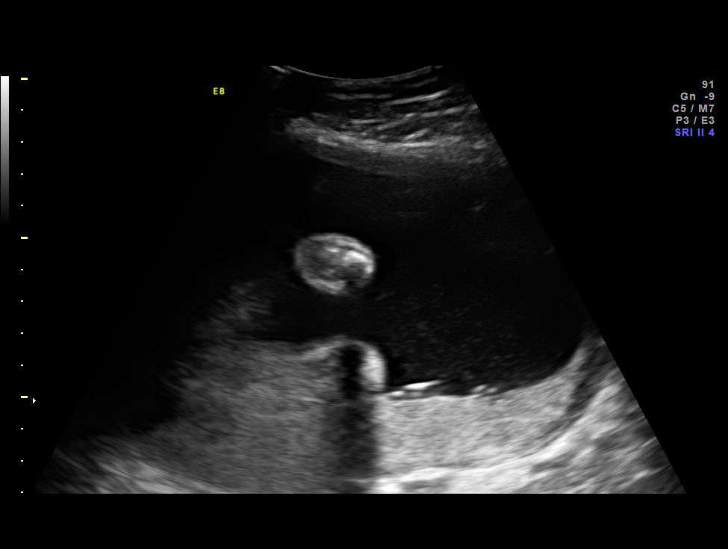
[im 5/17]
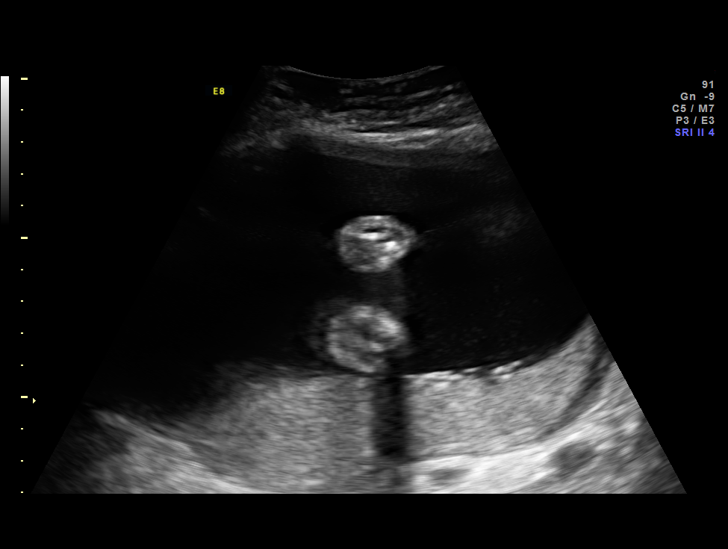
[im 6/17]
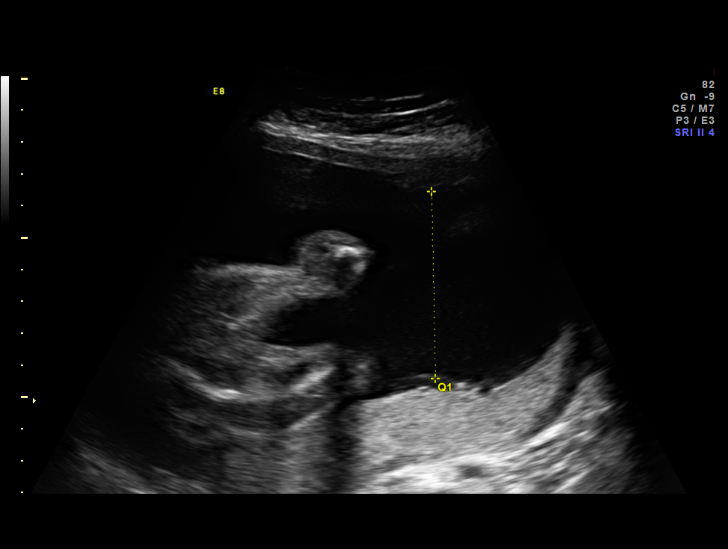
[im 8/17]
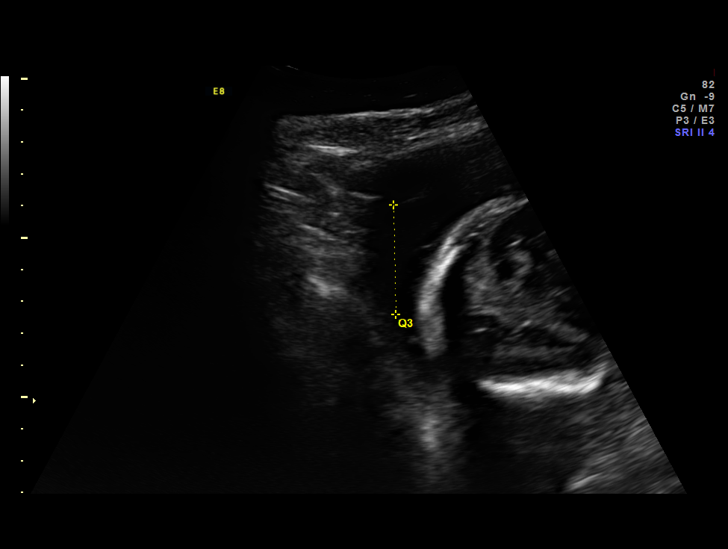
[im 9/17]
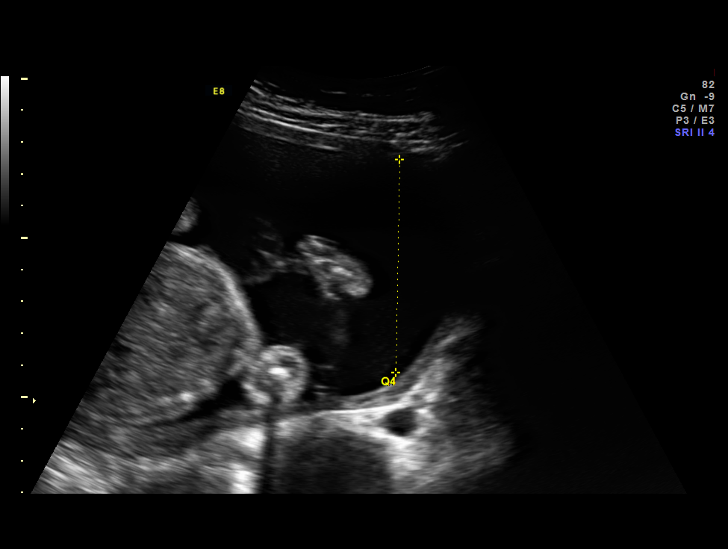
[im 10/17]
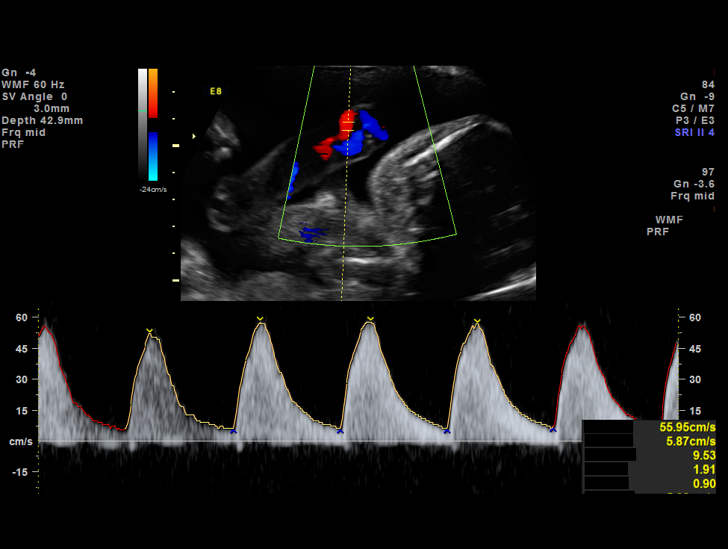
[im 12/17]
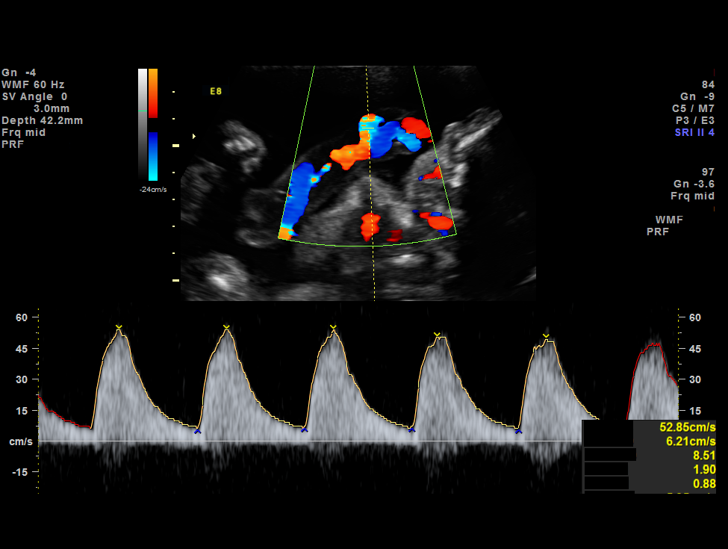
[im 13/17]
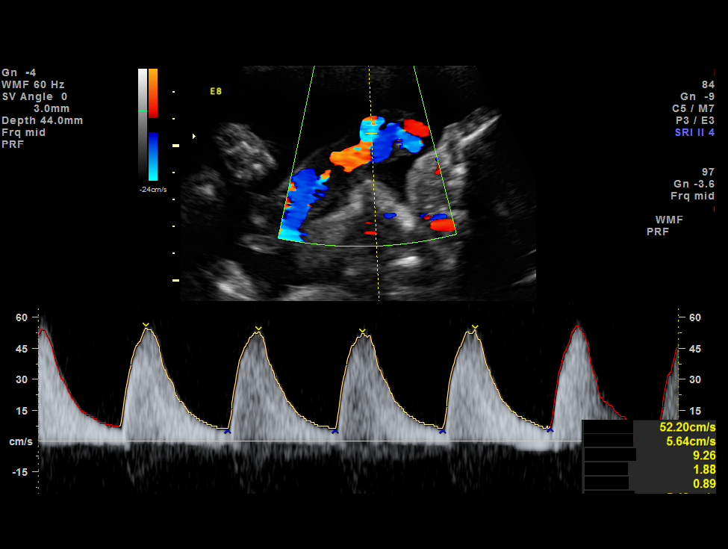
[im 14/17]
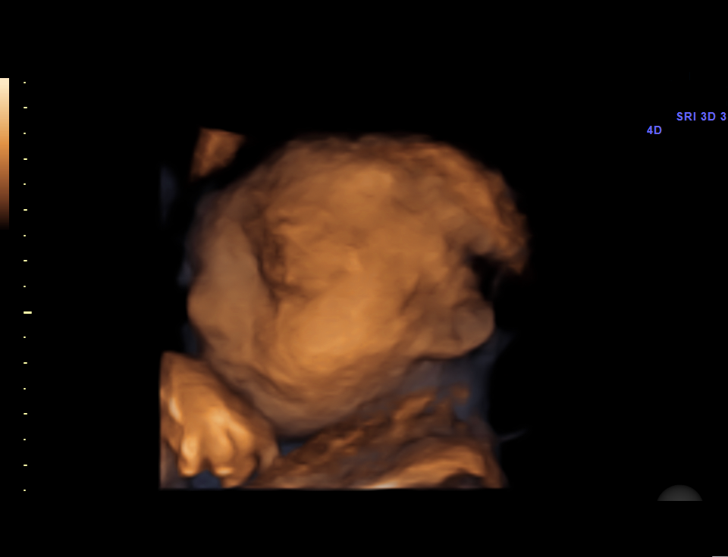
[im 16/17]
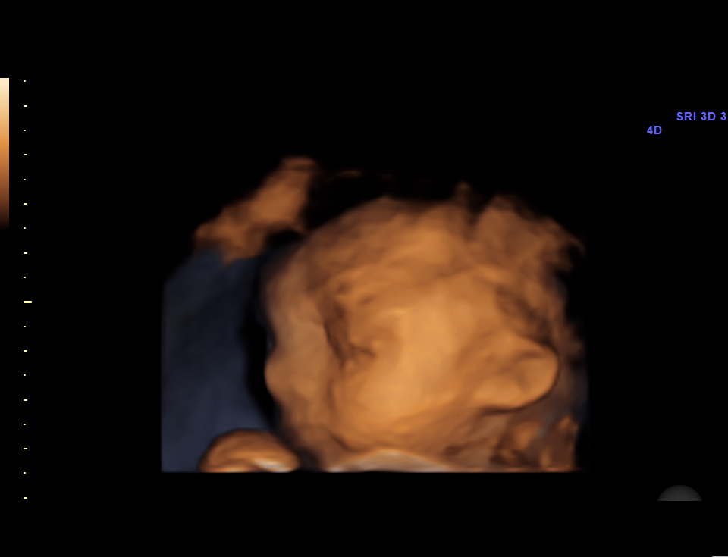
[im 17/17]
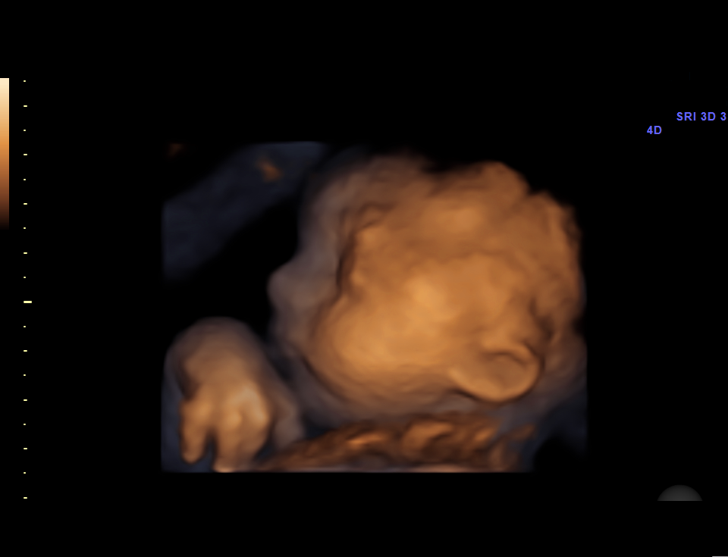

[13 of 17 positions shown; findings below may reference images not displayed]

OBSTETRICS REPORT
                      (Signed Final 11/22/2013 [DATE])

Service(s) Provided

 US UA CORD DOPPLER                                    76820.0
Indications

 Trisomy 18 (high risk NIPS)
 Diabetes - Pregestational, type 1 on insulin
 injections
 Cardiac (heart) anomaly (VSD, mesocardia)
 Left Club foot
 Fetal abnormality - other known or suspected
 (absent stomach, abnormal limbs, flat profile, CPC,
 EIF, pyelectasis)
Fetal Evaluation

 Num Of Fetuses:    1
 Fetal Heart Rate:  126                          bpm
 Cardiac Activity:  Observed
 Presentation:      Cephalic
 Placenta:          Posterior, above cervical
                    os
 P. Cord            Previously Visualized
 Insertion:

 Amniotic Fluid
 AFI FV:      Subjectively within normal limits
 AFI Sum:     20.91   cm       86  %Tile     Larg Pckt:    6.69  cm
 RUQ:   5.87    cm   RLQ:    6.69   cm    LUQ:   4.91    cm   LLQ:    3.44   cm
Gestational Age

 LMP:           28w 2d        Date:  05/08/13                 EDD:   02/12/14
 Best:          26w 6d     Det. By:  Early Ultrasound         EDD:   02/22/14
                                     (07/19/13)
Doppler - Fetal Vessels

 Umbilical Artery
 S/D:   8.54       > 97.5  %tile       RI:
 PI:    1.91                           PSV:       55.95   cm/s
 Umbilical Artery
 Absent DFV:    No     Reverse DFV:    No
Cervix Uterus Adnexa

 Cervix:       Normal appearance by transabdominal scan. Appears
               closed, without funnelling.
Impression

 IUP at 26+6 weeks
 Trisomy 18 by NIPS; multiple anomalies c/w T18; severe fetal
 growth restriction
 AFI is normal today
 UA Doppler S/D remain abnormal (elevated >2SD above the
 mean for gestational age) but no absent or reverse EDF today

 Patient desires intervention on fetal behalf to prevent IUFD if
 nonreassuring status is noted.  BPP 11/21/13 was [DATE].  No
 clinical indication to repeat today given Doppler results.
Recommendations

 Follow up BPP and UA Doppler on [REDACTED], 11/26/13.
 Fetal Chayba Gago

 questions or concerns.

## 2014-10-07 ENCOUNTER — Encounter (HOSPITAL_COMMUNITY): Payer: Self-pay

## 2014-10-16 IMAGING — US US UA CORD DOPPLER
1 series · 13 of 28 positions shown · non-contrast
Comparison: none

[Series 1: us ua cord doppler · 0.24mm/px · 13 of 30 slices shown]
[im 2/30]
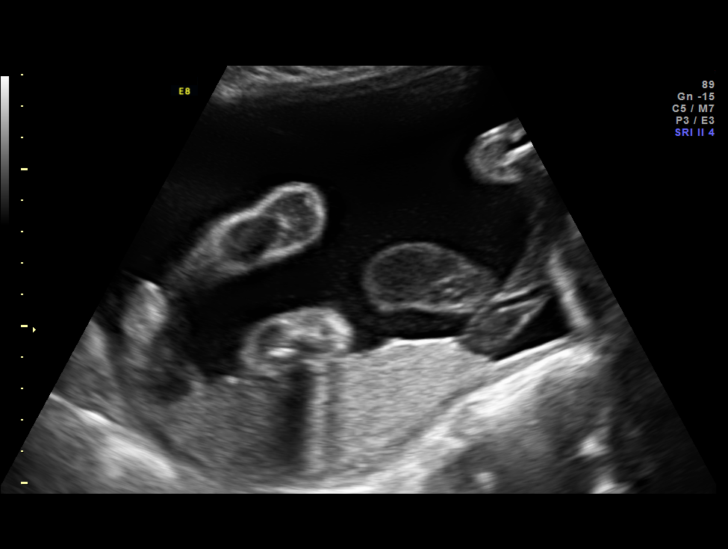
[im 4/30]
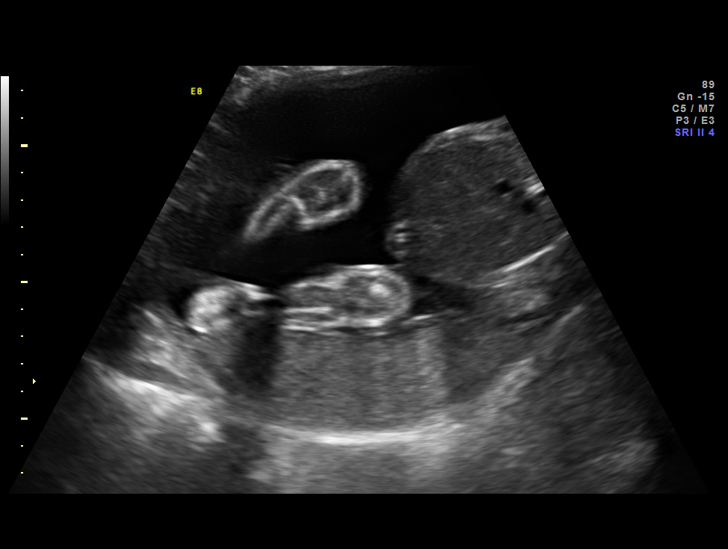
[im 6/30]
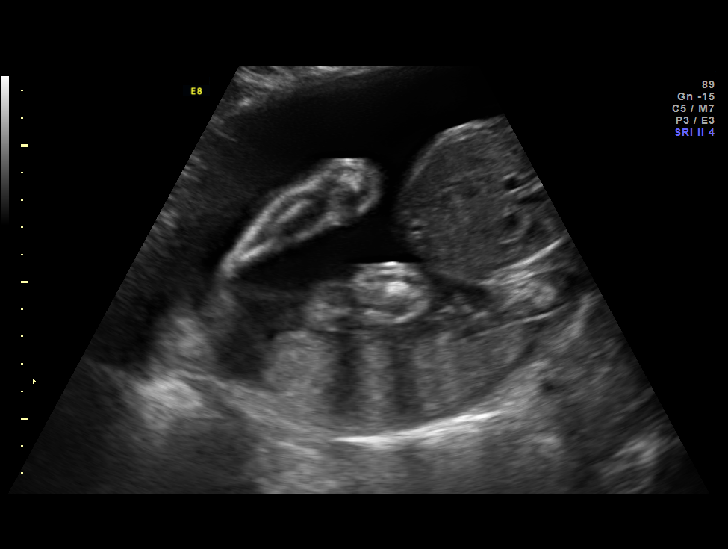
[im 8/30]
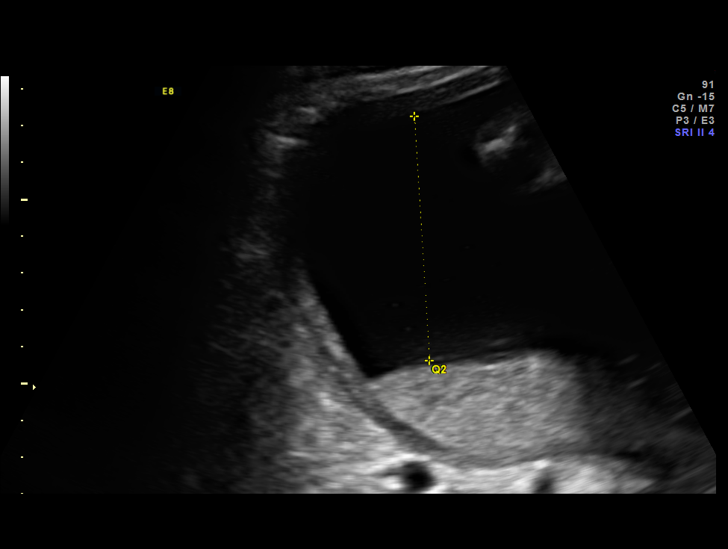
[im 10/30]
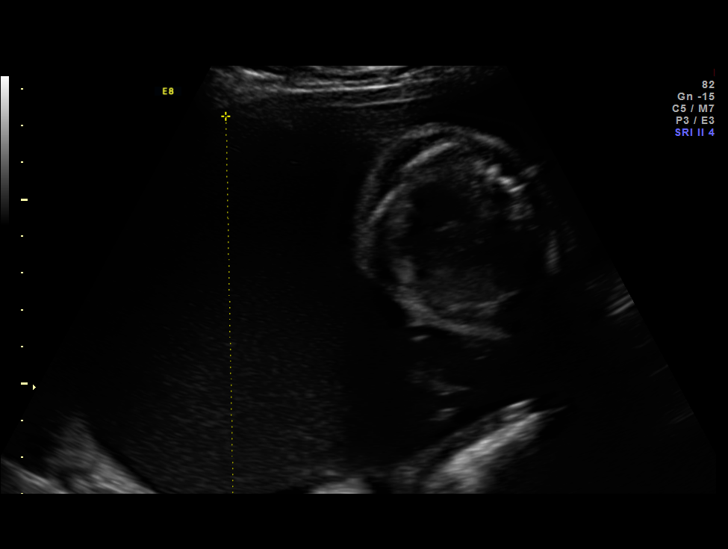
[im 12/30]
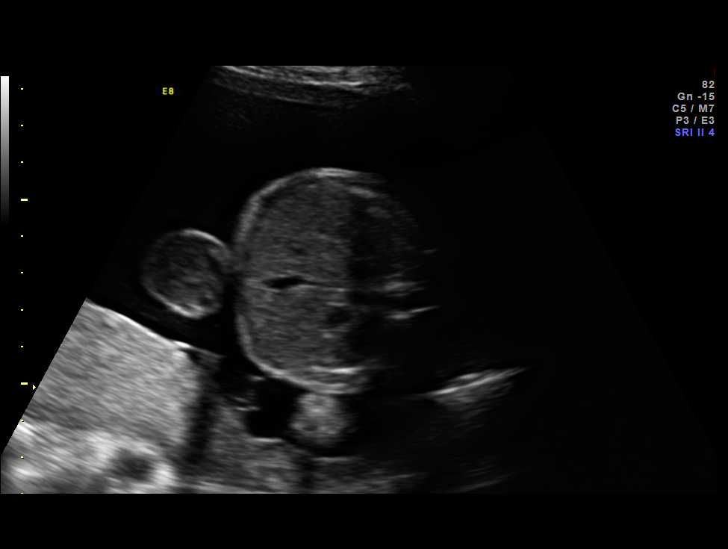
[im 16/30]
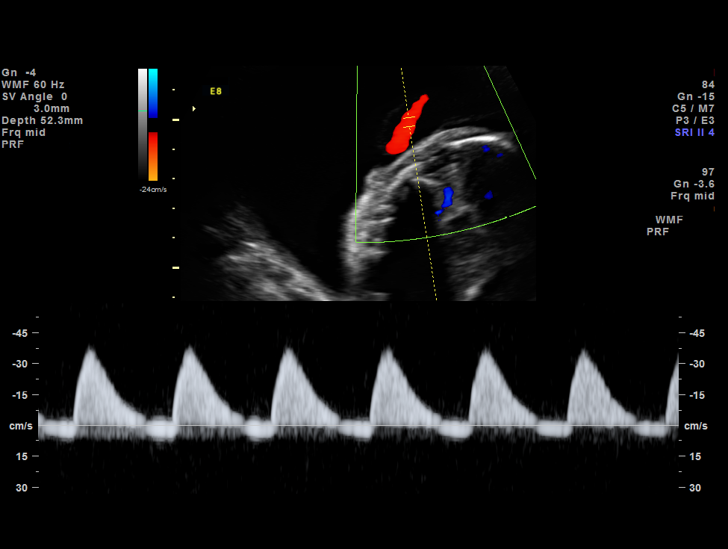
[im 18/30]
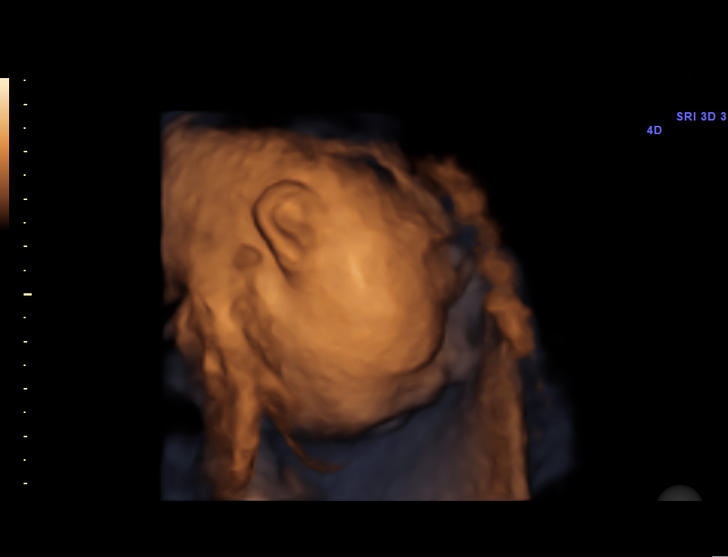
[im 20/30]
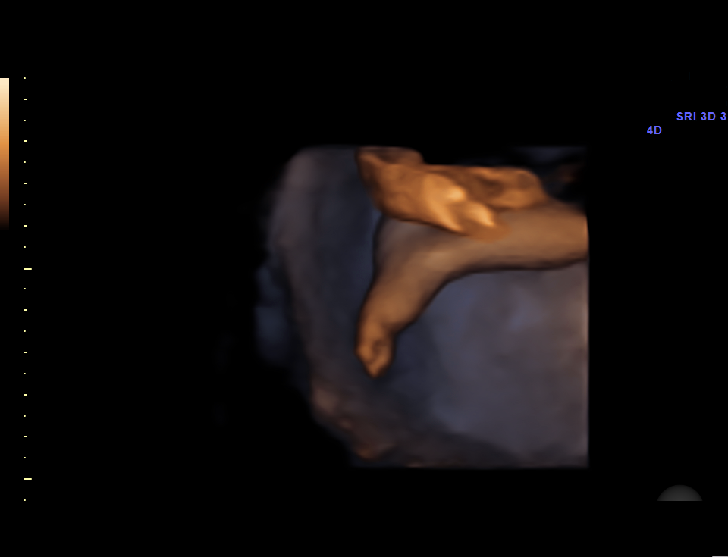
[im 22/30]
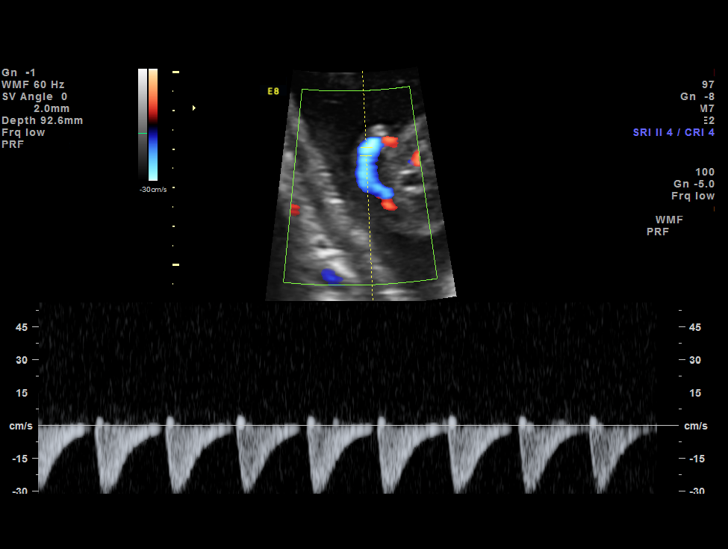
[im 24/30]
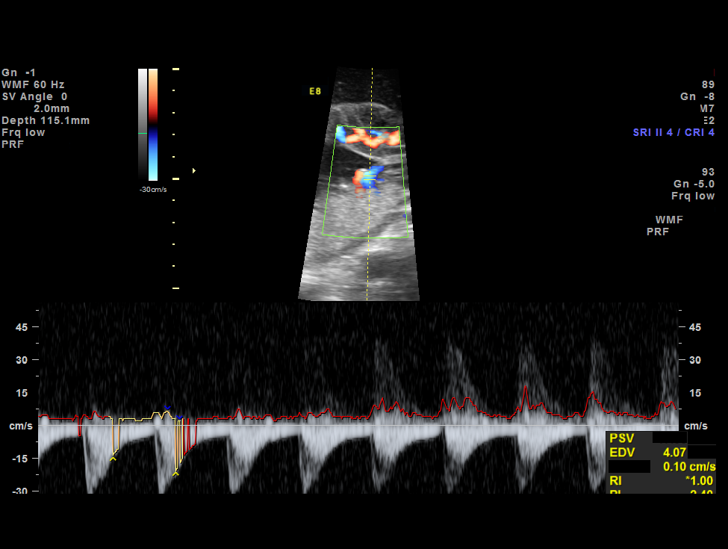
[im 26/30]
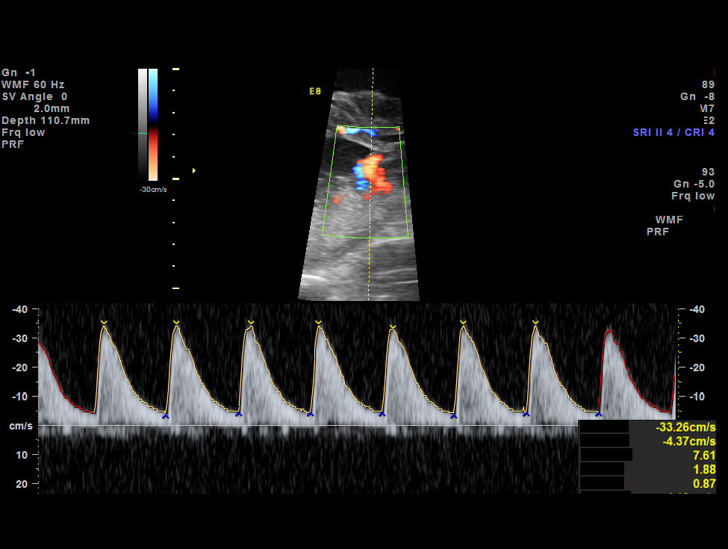
[im 28/30]
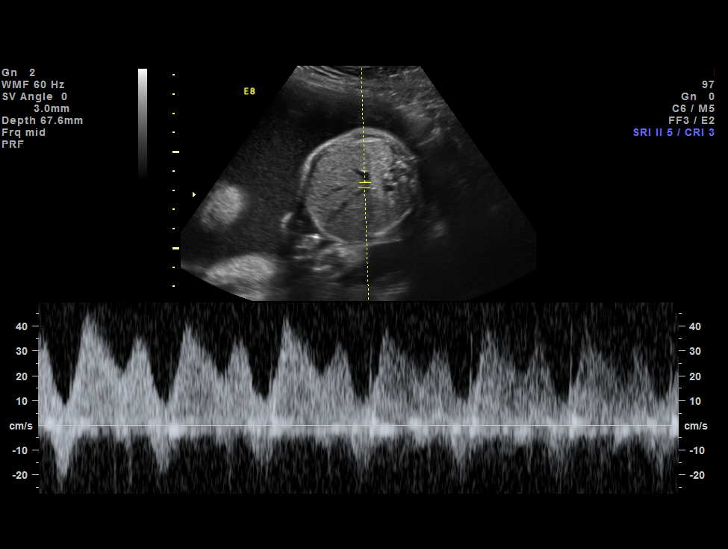

[13 of 28 positions shown; findings below may reference images not displayed]

OBSTETRICS REPORT
                      (Signed Final 12/04/2013 [DATE])

Service(s) Provided

 US UA CORD DOPPLER                                    76820.0
Indications

 Trisomy 18 (high risk NIPS)
 Diabetes - Pregestational, type 1 on insulin
 injections
 Cardiac (heart) anomaly (VSD, mesocardia)
 Left Club foot
 Fetal abnormality - other known or suspected
 (absent stomach, abnormal limbs, flat profile, CPC,
 EIF, pyelectasis)
Fetal Evaluation

 Num Of Fetuses:    1
 Fetal Heart Rate:  114                          bpm
 Cardiac Activity:  Observed
 Presentation:      Cephalic
 Placenta:          Posterior, above cervical
                    os

 Amniotic Fluid
 AFI FV:      Polyhydramnios
 AFI Sum:     30.41   cm     > 97  %Tile     Larg Pckt:  10.88   cm
 RUQ:   7.15    cm   RLQ:    10.88  cm    LUQ:   6.65    cm   LLQ:    5.73   cm
Biophysical Evaluation

 Amniotic F.V:   Within normal limits       F. Tone:        Observed
 F. Movement:    Observed                   Score:          [DATE]
 F. Breathing:   Observed
Gestational Age

 LMP:           30w 0d        Date:  05/08/13                 EDD:   02/12/14
 Best:          28w 4d     Det. By:  Early Ultrasound         EDD:   02/22/14
                                     (07/19/13)
Doppler - Fetal Vessels
 Umbilical Artery
 Absent DFV:    Yes    Reverse DFV:    Yes

Cervix Uterus Adnexa

 Cervix:       Normal appearance by transabdominal scan. Appears
               closed, without funnelling.
Impression

 SIUP at 28+4 weeks
 Fetus with trisomy 18; fetal growth restriction; polyhydramnios
 UA dopplers were concerning
 BPP and fetal heart rate tracing were reassuring

 After a lengthy discussion, Ms. Link and her family have
 opted to continue daily outpatient management.
Recommendations

 May consider discontinuing dopplers, at least on a daily
 schedule, and basing management on BPP and tracings

 questions or concerns.

## 2014-10-18 IMAGING — US US UA CORD DOPPLER
1 series · 13 of 27 positions shown · non-contrast
Comparison: none

[Series 1: us fetal bpp w/o nonstress · non-contrast · 27 acquisitions, 13 frames shown]
[im 2/27]
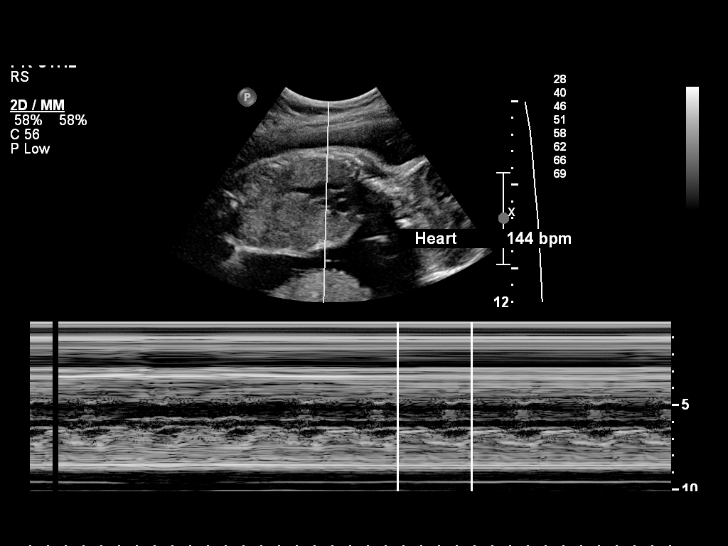
[im 4/27]
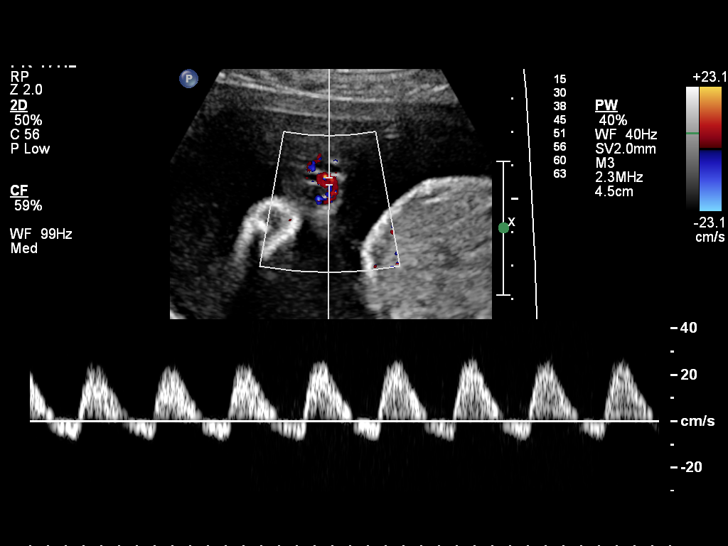
[im 6/27]
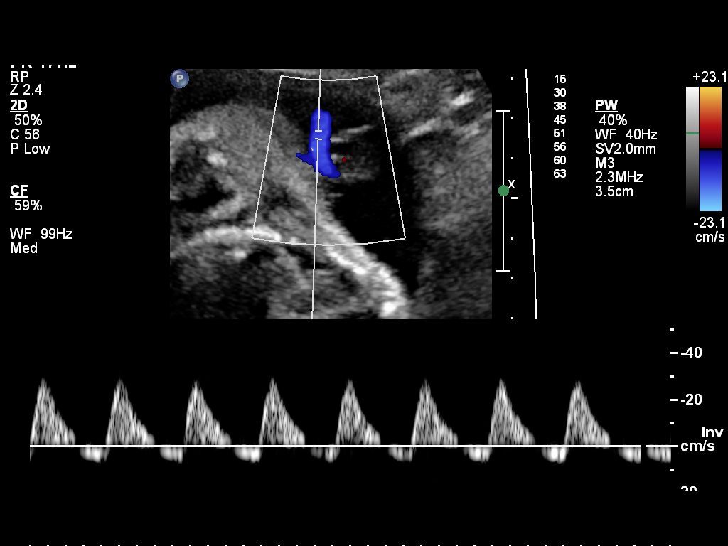
[im 8/27]
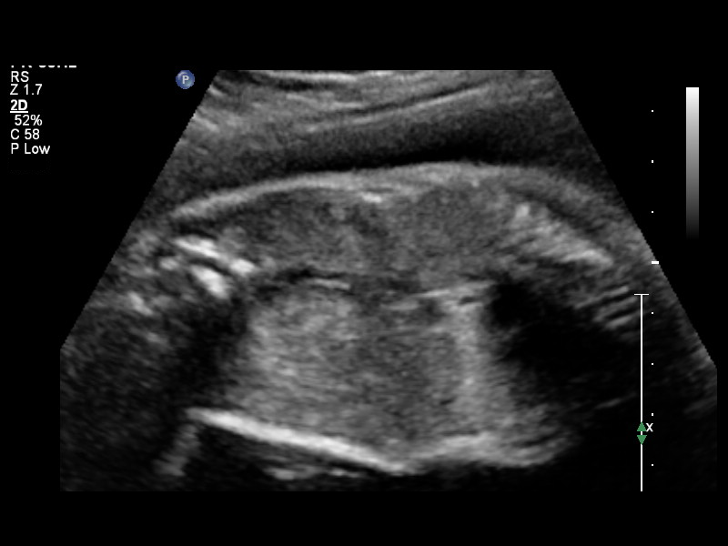
[im 10/27]
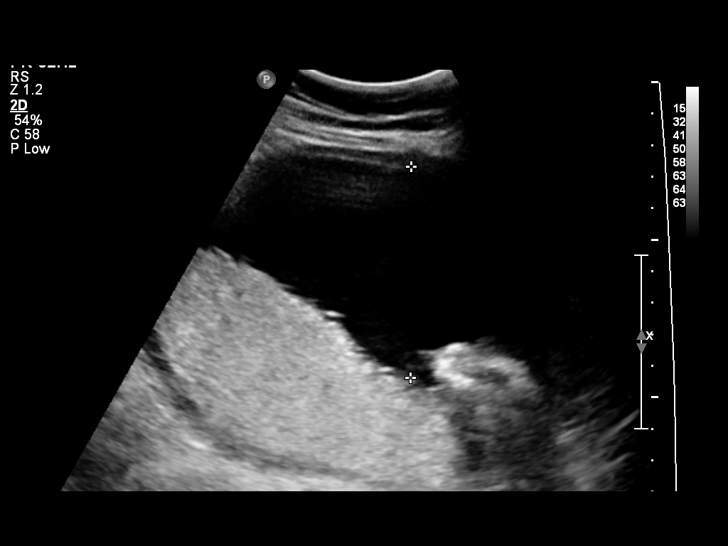
[im 12/27]
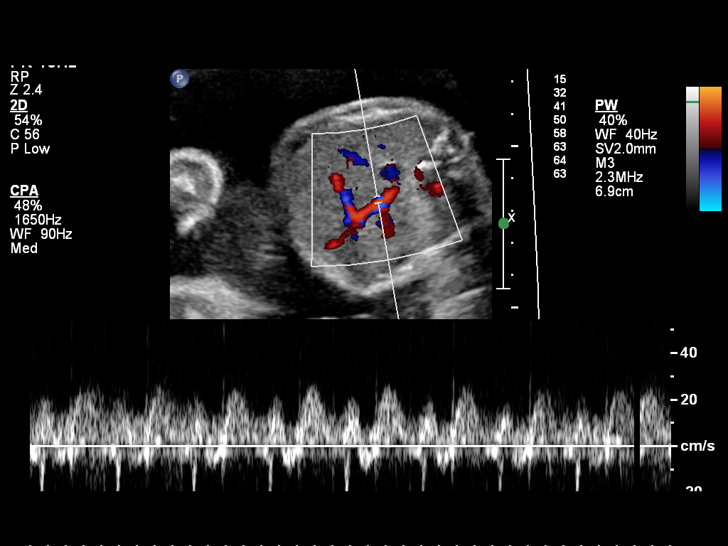
[im 14/27]
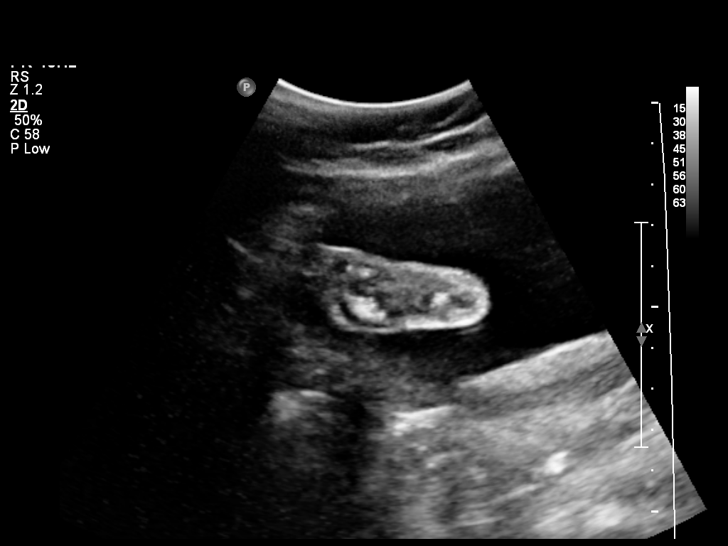
[im 16/27]
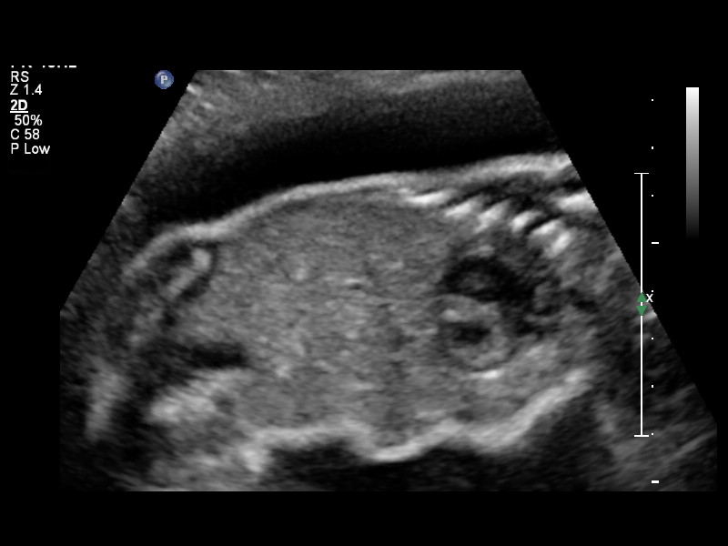
[im 18/27]
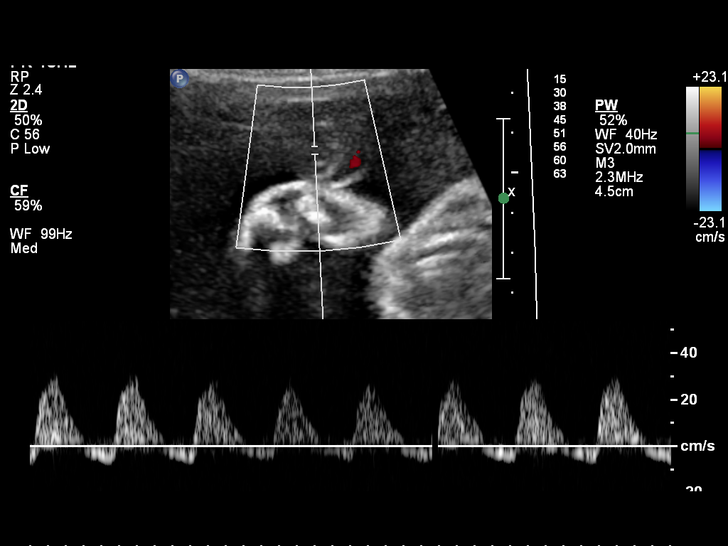
[im 20/27]
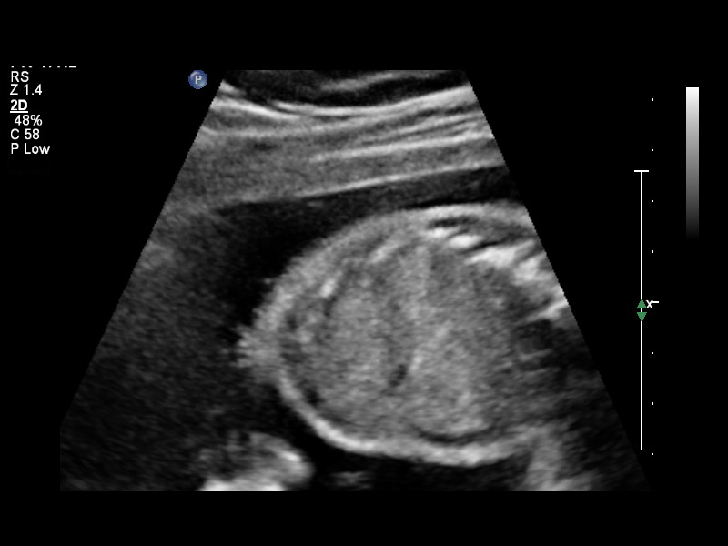
[im 22/27]
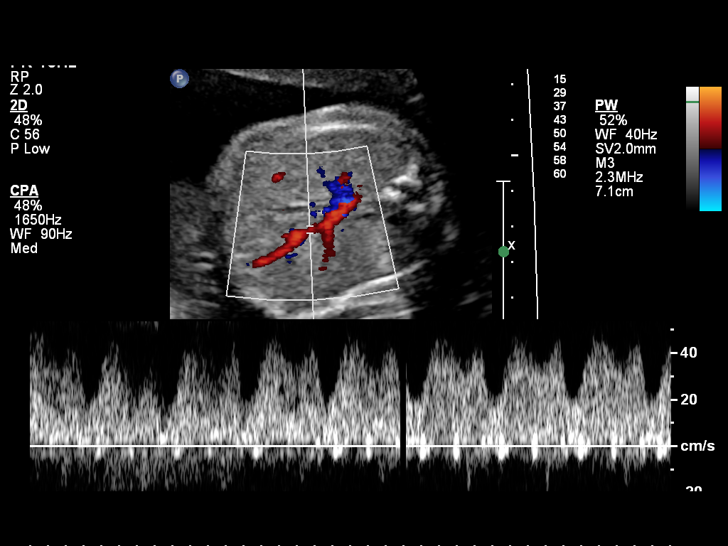
[im 24/27]
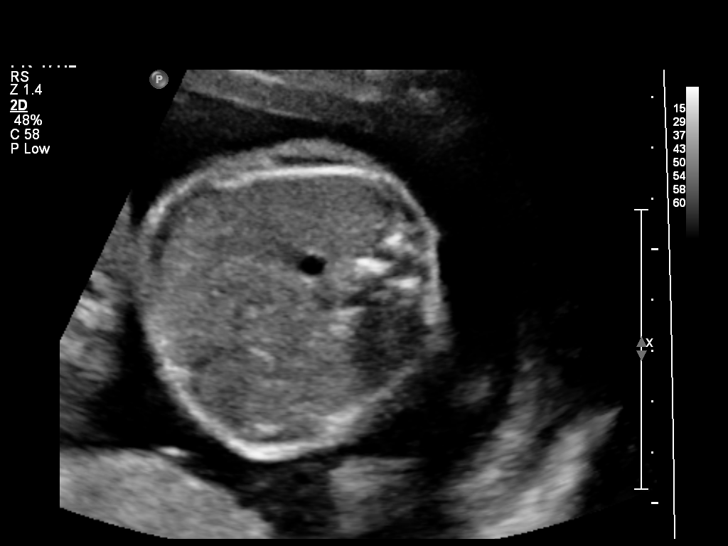
[im 26/27]
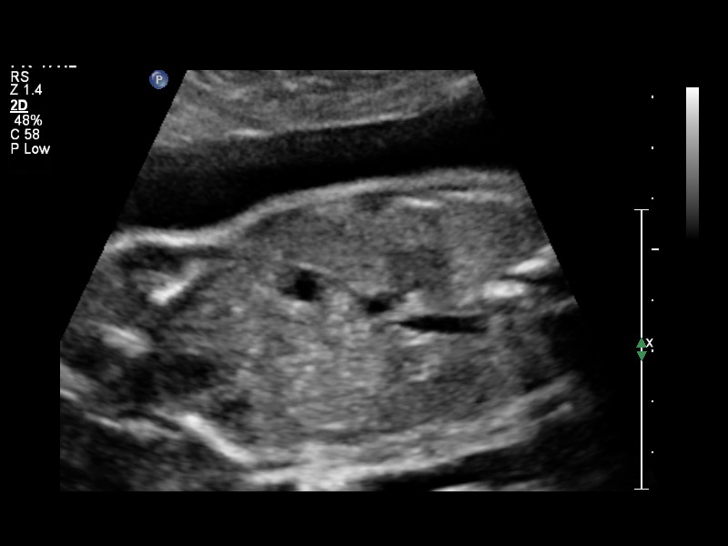

[13 of 27 positions shown; findings below may reference images not displayed]

OBSTETRICS REPORT
                      (Signed Final 12/06/2013 [DATE])

Service(s) Provided

 US UA CORD DOPPLER                                    76820.0
Indications

 Trisomy 18 (high risk NIPS)
 Diabetes - Pregestational, type 1 on insulin
 injections
 Cardiac (heart) anomaly (VSD, mesocardia)
 Left Club foot
 Fetal abnormality - other known or suspected
 (absent stomach, abnormal limbs, flat profile, CPC,
 EIF, pyelectasis)
Fetal Evaluation

 Num Of Fetuses:    1
 Fetal Heart Rate:  144                          bpm
 Cardiac Activity:  Observed
 Presentation:      Cephalic

 Amniotic Fluid
 AFI FV:      Polyhydramnios
                                             Larg Pckt:     9.2  cm
Biophysical Evaluation

 Amniotic F.V:   Pocket => 2 cm two         F. Tone:        Observed
                 planes
 F. Movement:    Observed                   Score:          [DATE]
 F. Breathing:   Not Observed
Gestational Age

 LMP:           30w 2d        Date:  05/08/13                 EDD:   02/12/14
 Best:          28w 6d     Det. By:  Early Ultrasound         EDD:   02/22/14
                                     (07/19/13)
Doppler - Fetal Vessels

 Umbilical Artery
                       Reverse DFV:    Yes
Impression

 Single IUP at 28 [DATE] weeks
 Multiple fetal anomalies, Fetal growth restriction,Trisomy 18
 by NIPS
 UA Dopplers -persistently reversed diastolic flow; forward
 flow noted on single clip of Ductus venosus
 BPP [DATE] (sporatic breathing movement)
 Polyhydramnios with MVP of 8.7 cm.

 Ms. Link wants wants intervention on fetal behalf to avoid
 stillbirth.  Over last 24 hrs, the UA Dopplers have worsened
 somewhat and she reports that fetal movement has also
 somewhat decreased.  It is difficult to interpret the clinical
 significance of abnormal Dopplers in a fetus with Trisomy 18.

 Based on this assessment, she has elected to be admitted
 for closer monitoring.
Recommendations

 Plan continuous fetal monitoring, daily BPPs + / - UA Dopplers.
 Would move toward delivery if the fetal strip or BPP were
 concerning for impending IUFD.

 questions or concerns.

## 2014-12-06 NOTE — L&D Delivery Note (Signed)
Pt complete and at 0 station with urge to push. Epidural controlling pain except for right hip. Pt pushed for just over an hour to deliver a viable female infant in ROA position. Turtle sign noted on delivery of head. Cheeks and chin flush with perineum. Loose nuchal x 1 was reduced over infants head. Attempt to deliver anterior shoulder with next two pushes failed hence McRoberts simultaneously employed. A third attempt was also unsuccessful hence suprapubic pressure directed to be applied. Again, should failed to dislodge. Pt was placed in Gaskins position next after a second degree episiotomy cut. Anterior shoulder was delivered with next two pushes. Posterior shoulder continued to be lodged and attempt at rotating anteriorly failed hence directed pt to be placed on her back in McRoberts again and with next attempt posterior arm was delivered. Body easily followed next. Cord was quickly clamped and cut and infant was handed over to awaiting NICU staff. Cord gases and blood obtained. Placenta then delivered about 5 mins later intact, 3VC shultz. Fundal massage performed and pitocin per protocol. Fundus firm. 2nd degree episiotomy was noted to have extended into a fourth degree tear. This was repaired using 2 0-vicryl sutures first to reapproximate the muscle and capsule at 12/3/6 and 9 oclock then the same suture was used to reapproximate the vaginal floor. A finger was placed in the rectum to confirm stability of vaginal floor. Gloves were changed and finally the skin was reapproximated using 3-0 vicryl in a subcuticular pattern. Pt tolerated well. Counts were noted to be correct

## 2015-03-26 LAB — OB RESULTS CONSOLE HEPATITIS B SURFACE ANTIGEN: HEP B S AG: NEGATIVE

## 2015-03-26 LAB — OB RESULTS CONSOLE HIV ANTIBODY (ROUTINE TESTING): HIV: NONREACTIVE

## 2015-03-26 LAB — OB RESULTS CONSOLE GC/CHLAMYDIA
CHLAMYDIA, DNA PROBE: NEGATIVE
GC PROBE AMP, GENITAL: NEGATIVE

## 2015-03-26 LAB — OB RESULTS CONSOLE RPR: RPR: NONREACTIVE

## 2015-04-30 ENCOUNTER — Encounter: Payer: PRIVATE HEALTH INSURANCE | Attending: Internal Medicine | Admitting: *Deleted

## 2015-04-30 ENCOUNTER — Encounter: Payer: Self-pay | Admitting: *Deleted

## 2015-04-30 VITALS — Ht 61.5 in | Wt 125.7 lb

## 2015-04-30 DIAGNOSIS — E1043 Type 1 diabetes mellitus with diabetic autonomic (poly)neuropathy: Secondary | ICD-10-CM | POA: Diagnosis present

## 2015-04-30 DIAGNOSIS — Z713 Dietary counseling and surveillance: Secondary | ICD-10-CM | POA: Diagnosis not present

## 2015-04-30 DIAGNOSIS — Z794 Long term (current) use of insulin: Secondary | ICD-10-CM | POA: Insufficient documentation

## 2015-04-30 NOTE — Progress Notes (Signed)
Introduction to Insulin Pump Therapy:  Date: 04/30/15  Appt start time: 0830 end time:  1000.  Assessment:  This patient has DM 1 and their primary concerns today: is willing to go on pump to improve control of diabetes especially during pregnancy. She states she has anxiety and depression but is off of those medications while pregnant. She states she is doing "OK" right now, but is concerned about how she will handle stress as pregnancy progresses. She is very nervous about depending on a machine to deliver her insulin in place of her having control over giving insulin with pen.   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  Patient states their expectations of pump therapy include: better BG control especially during pregnancy. She was on [employer] pump when she was in [employer] but there was a pump failure and she was hospitalized with DKA. She lost faith in the pump and went back to injections. She is hesitant to go on another pump but understands the benefits and is willing  to try it again. Patient expresses understanding that for improved outcomes for their diabetes on an insulin pump they will:  Check BG at least 4 times per day  Change out pump infusion set at least every 3 days  Upload pump information to their pump's Web Based Program on a regular basis so provider can assess patterns and make setting adjustments.     Intervention:    Taught difference between delivery of insulin via syringe/pen compared to insulin pump.  Demonstrated improved insulin delivery via pump due to improved accuracy of dose and flexibility of adjusting bolus insulin based on carb intake and BG correction.  Showed patient the following pumps: Medtronic, Tandem due to higher insulin volume needs for pregnancy. Due to insurance potential limit of coverage for Tandem, Medtronic was shown as back up as it is typically covered.   Demonstrated pump, insulin reservoir and infusion set options, and button pushing for bolus delivery of insulin through the pump  Explained importance of testing BG at least 4 times per day for appropriate correction of high BG and prevention of DKA as applicable.  Emphasized importance of follow up after Pump Start for appropriate pump setting adjustments and on-going training on more advanced features.  Discussed current Continuous Glucose Monitoring options. She is not interested in additional products attached to her body at this time. I mentioned the possibility of doing a Professional Dexcom assessment during her pregnancy to give he a chance to try it for a week  and see how she responds.  Handouts given during visit include:  Introduction to Pump Therapy Handout  DM 1 Support Group Flyer  Insulin Pump Packet from T-Flex and Medtronic  Monitoring/Evaluation:    Patient does want to continue with pursuit of T-flex insulin pump.  Patient requested that I contact that Pump Company Rep so they can be contacted to start the process of obtaining  the pump.  Patient instructed to go to that pump web-site to complete any learning module on insulin pump prior to next visit  Once pump is shipped, patient to call this office to set up training.

## 2015-06-03 ENCOUNTER — Encounter: Payer: PRIVATE HEALTH INSURANCE | Attending: Internal Medicine | Admitting: *Deleted

## 2015-06-03 DIAGNOSIS — E1043 Type 1 diabetes mellitus with diabetic autonomic (poly)neuropathy: Secondary | ICD-10-CM | POA: Insufficient documentation

## 2015-06-03 DIAGNOSIS — Z713 Dietary counseling and surveillance: Secondary | ICD-10-CM | POA: Insufficient documentation

## 2015-06-03 DIAGNOSIS — Z794 Long term (current) use of insulin: Secondary | ICD-10-CM | POA: Insufficient documentation

## 2015-06-06 ENCOUNTER — Ambulatory Visit: Payer: PRIVATE HEALTH INSURANCE | Admitting: *Deleted

## 2015-06-13 ENCOUNTER — Ambulatory Visit: Payer: PRIVATE HEALTH INSURANCE | Admitting: *Deleted

## 2015-08-05 ENCOUNTER — Encounter (HOSPITAL_COMMUNITY): Payer: Self-pay | Admitting: *Deleted

## 2015-08-05 ENCOUNTER — Inpatient Hospital Stay (HOSPITAL_COMMUNITY)
Admission: AD | Admit: 2015-08-05 | Discharge: 2015-08-05 | Disposition: A | Discharge status: Home / Self Care | Payer: PRIVATE HEALTH INSURANCE | Source: Ambulatory Visit | Attending: Obstetrics and Gynecology | Admitting: Obstetrics and Gynecology

## 2015-08-05 DIAGNOSIS — O26893 Other specified pregnancy related conditions, third trimester: Secondary | ICD-10-CM | POA: Diagnosis not present

## 2015-08-05 DIAGNOSIS — R000 Tachycardia, unspecified: Secondary | ICD-10-CM | POA: Diagnosis present

## 2015-08-05 DIAGNOSIS — E109 Type 1 diabetes mellitus without complications: Secondary | ICD-10-CM | POA: Diagnosis not present

## 2015-08-05 DIAGNOSIS — Z3A27 27 weeks gestation of pregnancy: Secondary | ICD-10-CM | POA: Diagnosis not present

## 2015-08-05 DIAGNOSIS — O24012 Pre-existing diabetes mellitus, type 1, in pregnancy, second trimester: Secondary | ICD-10-CM | POA: Insufficient documentation

## 2015-08-05 DIAGNOSIS — O24912 Unspecified diabetes mellitus in pregnancy, second trimester: Secondary | ICD-10-CM

## 2015-08-05 DIAGNOSIS — O09292 Supervision of pregnancy with other poor reproductive or obstetric history, second trimester: Secondary | ICD-10-CM | POA: Insufficient documentation

## 2015-08-05 LAB — COMPREHENSIVE METABOLIC PANEL
ALT: 15 U/L (ref 14–54)
AST: 14 U/L — ABNORMAL LOW (ref 15–41)
Albumin: 2.9 g/dL — ABNORMAL LOW (ref 3.5–5.0)
Alkaline Phosphatase: 72 U/L (ref 38–126)
Anion gap: 9 (ref 5–15)
BUN: 10 mg/dL (ref 6–20)
CO2: 23 mmol/L (ref 22–32)
Calcium: 9.1 mg/dL (ref 8.9–10.3)
Chloride: 101 mmol/L (ref 101–111)
Creatinine, Ser: 0.57 mg/dL (ref 0.44–1.00)
GFR calc Af Amer: >60 mL/min (ref >60)
GFR calc non Af Amer: >60 mL/min (ref >60)
Glucose, Bld: 178 mg/dL — ABNORMAL HIGH (ref 65–99)
Potassium: 3.5 mmol/L (ref 3.5–5.1)
Sodium: 133 mmol/L — ABNORMAL LOW (ref 135–145)
Total Bilirubin: 0.5 mg/dL (ref 0.3–1.2)
Total Protein: 6.9 g/dL (ref 6.5–8.1)

## 2015-08-05 LAB — CBC
HCT: 33.4 % — ABNORMAL LOW (ref 36.0–46.0)
Hemoglobin: 11.4 g/dL — ABNORMAL LOW (ref 12.0–15.0)
MCH: 31.6 pg (ref 26.0–34.0)
MCHC: 34.1 g/dL (ref 30.0–36.0)
MCV: 92.5 fL (ref 78.0–100.0)
Platelets: 281 10*3/uL (ref 150–400)
RBC: 3.61 MIL/uL — ABNORMAL LOW (ref 3.87–5.11)
RDW: 12.4 % (ref 11.5–15.5)
WBC: 14.2 10*3/uL — ABNORMAL HIGH (ref 4.0–10.5)

## 2015-08-05 LAB — URINALYSIS, ROUTINE W REFLEX MICROSCOPIC
Bilirubin Urine: NEGATIVE
Glucose, UA: >1000 mg/dL — AB
Ketones, ur: 15 mg/dL — AB
LEUKOCYTES UA: NEGATIVE
Nitrite: NEGATIVE
PH: 5.5 (ref 5.0–8.0)
Protein, ur: 30 mg/dL — AB
Specific Gravity, Urine: 1.02 (ref 1.005–1.030)
Urobilinogen, UA: 0.2 mg/dL (ref 0.0–1.0)

## 2015-08-05 LAB — URINE MICROSCOPIC-ADD ON

## 2015-08-05 NOTE — MAU Note (Signed)
CBG 90-100 today per patient, up to 200 after her PCP recommended coming to MAU for evaluation and patient states her anxiety level was up.

## 2015-08-05 NOTE — MAU Note (Signed)
Patient was seen at PCP today for tachycardia and sore throat.  She had a swab for strep that came back negative and cultures were sent.  In office BP was 140s/80s and HR was 140s.  Patient states having a history of anxiety and panic attacks and not taking Lexapro or Xanax while pregnant.  Also Type 1 Diabetic well controlled per patient.  Denies LOF or vaginal bleeding. +Fetal movement.

## 2015-08-05 NOTE — MAU Provider Note (Signed)
Chief Complaint:  Hypertension and Tachycardia   First Provider Initiated Contact with Patient 08/05/15 1937      HPI: [name] is a 24 y.o. G2P0101 at [redacted]w[redacted]d who presents to maternity admissions reporting episode of racing heart earlier today. She also had sore throat so called and was seen by her PCP to get strep throat swab. During her PCP visit, she was noted to have elevated HR to 140s and elevated BP.  She was sent to MAU for further evaluation because she is pregnant.  Pt reports feeling better upon arrival in MAU.  She does report hx of anxiety and was taking antidepressants and anxiety medication PRN prior to pregnancy but has done well during pregnancy without these medicines.  She reports good fetal movement, denies cramping/contractions, LOF, vaginal bleeding, vaginal itching/burning, urinary symptoms, h/a, dizziness, n/v, or fever/chills.    Pt called endocrinologist and made appt for tomorrow morning at 9 am while she was in MAU.  HPI  Past Medical History: Past Medical History  Diagnosis Date  . Type 1 diabetes mellitus in patient age 36-19 years with HbA1C goal below 7.5   . Hypoglycemia associated with diabetes   . Goiter   . Diabetic peripheral neuropathy associated with type 1 diabetes mellitus   . Type 1 diabetes mellitus with diabetic autonomic neuropathy   . Diabetic peripheral angiopathy   . Tachycardia   . Febrile seizures     Seizures occurred at about 14/-99 months of age.  . Diabetes mellitus type I   . HSV-1 infection     Past obstetric history: OB History  Gravida Para Term Preterm AB SAB TAB Ectopic Multiple Living  # Outcome Date GA Lbr Len/2nd Weight Sex Delivery Anes PTL Lv  2 Current           1 Preterm 12/08/13 [redacted]w[redacted]d 07:15 / 00:13 0.78 kg (1 lb 11.5 oz) M Vag-Spont EPI  Y      Past Surgical History: Past Surgical History  Procedure Laterality Date  . Renal biopsy at age 43    . Indecision and drainage of  staphylococcal facial abscess    . Abcess drainage      Family History: Family History  Problem Relation Age of Onset  . Hypertension Father   . Drug abuse Father   . Alcohol abuse Father   . Heart disease Maternal Grandmother   . Hyperlipidemia Maternal Grandmother   . Heart disease Paternal Grandfather   . Alcohol abuse Paternal Grandfather   . Hyperlipidemia Paternal Grandfather   . Hyperlipidemia Maternal Grandfather   . Hyperlipidemia Paternal Grandmother     Social History: Social History  Substance Use Topics  . Smoking status: Never Smoker   . Smokeless tobacco: Never Used  . Alcohol Use: No    Allergies:  Allergies  Allergen Reactions  . Pollen Extract Hives, Shortness Of Breath and Swelling    Extreme problems with seasonal allergies (carries an epi-pen for them)  . Sulfa Antibiotics Hives and Nausea And Vomiting    Meds:  Prescriptions prior to admission  Medication Sig Dispense Refill Last Dose  . calcium carbonate (TUMS - DOSED IN MG ELEMENTAL CALCIUM) 500 MG chewable tablet Chew 2 tablets by mouth daily as needed for indigestion or heartburn.   Past Month at Unknown time  . EPINEPHrine (EPI-PEN) 0.3 mg/0.3 mL SOAJ injection Inject into the muscle once.   rescue  .  glucose blood (ONETOUCH VERIO) test strip Check blood sugar 10 x daily 200 each 4 Not Taking  . insulin lispro (HUMALOG) 100 UNIT/ML injection Inject 1.5 Units into the skin every hour. Delivered via an insulin pump.   08/05/2015 at 1800  . ONE TOUCH ULTRA TEST test strip USE TO CHECK BLOOD SUGAR 6 TO 8 TIMES A DAY 1 each 6 Taking  . Prenatal Vit-Fe Fumarate-FA (PRENATAL VITAMIN PO) Take 2 tablets by mouth daily.    08/04/2015 at Unknown time  . [DISCONTINUED] ibuprofen (ADVIL,MOTRIN) 600 MG tablet Take 1 tablet (600 mg total) by mouth every 6 (six) hours. (Patient not taking: Reported on 08/05/2015) 30 tablet 0 Not Taking at Unknown time  . [DISCONTINUED] NOVOLOG FLEXPEN 100 UNIT/ML SOPN FlexPen  INJECT 25 UNITS INTO THE SKIN 4 TIMES A DAY (BEFORE MEALS AND AT BEDTIME) (Patient not taking: Reported on 08/05/2015) 15 mL 0 Not Taking at Unknown time    ROS:  Review of Systems  Constitutional: Negative for fever, chills and fatigue.  HENT: Negative for sinus pressure.   Eyes: Negative for photophobia.  Respiratory: Negative for shortness of breath.   Cardiovascular: Negative for chest pain.  Gastrointestinal: Negative for nausea, vomiting, diarrhea and constipation.  Genitourinary: Negative for dysuria, frequency, flank pain, vaginal bleeding, vaginal discharge, difficulty urinating, vaginal pain and pelvic pain.  Musculoskeletal: Negative for neck pain.  Neurological: Negative for dizziness, weakness and headaches.  Psychiatric/Behavioral: Negative.      I have reviewed patient's Past Medical Hx, Surgical Hx, Family Hx, Social Hx, medications and allergies.   Physical Exam   Patient Vitals for the past 24 hrs:  BP Pulse Resp SpO2 Height Weight  08/05/15 2030 113/81 mmHg 113 - 96 % - -  08/05/15 2025 - 110 - 97 % - -  08/05/15 2020 - 109 - 96 % - -  08/05/15 2015 126/90 mmHg 112 - 92 % - -  08/05/15 2010 - 118 - 96 % - -  08/05/15 2005 - 120 - 96 % - -  08/05/15 2000 136/95 mmHg 118 - 92 % - -  08/05/15 1945 125/87 mmHg 115 - 95 % - -  08/05/15 1935 - 115 - 96 % - -  08/05/15 1930 127/87 mmHg (!) 123 - 97 % - -  08/05/15 1925 - (!) 124 - 96 % - -  08/05/15 1920 - 114 - 96 % - -  08/05/15 1915 128/85 mmHg 117 - 94 % - -  08/05/15 1900 133/88 mmHg (!) 122 - 95 % - -  08/05/15 1851 - - - - 5\' 1"  (1.549 m) 68.04 kg (150 lb)  08/05/15 1850 139/90 mmHg (!) 123 - 96 % - -  08/05/15 1837 136/97 mmHg (!) 122 (!) 80 98 % - -   Constitutional: Well-developed, well-nourished female in no acute distress.  Cardiovascular: normal rate Respiratory: normal effort GI: Abd soft, non-tender, gravid appropriate for gestational age.  MS: Extremities nontender, no edema, normal  ROM Neurologic: Alert and oriented x 4.  GU: Neg CVAT.     FHT:  Baseline 135 , moderate variability, accelerations present, no decelerations Contractions: None on toco or to palpation   Labs: Results for orders placed or performed during the hospital encounter of 08/05/15 (from the past 24 hour(s))  Urinalysis, Routine w reflex microscopic (not at [facility])     Status: Abnormal   Collection Time: 08/05/15  6:30 PM  Result Value Ref Range   Color, Urine YELLOW YELLOW  APPearance CLEAR CLEAR   Specific Gravity, Urine 1.020 1.005 - 1.030   pH 5.5 5.0 - 8.0   Glucose, UA >1000 (A) NEGATIVE mg/dL   Hgb urine dipstick TRACE (A) NEGATIVE   Bilirubin Urine NEGATIVE NEGATIVE   Ketones, ur 15 (A) NEGATIVE mg/dL   Protein, ur 30 (A) NEGATIVE mg/dL   Urobilinogen, UA 0.2 0.0 - 1.0 mg/dL   Nitrite NEGATIVE NEGATIVE   Leukocytes, UA NEGATIVE NEGATIVE  Urine microscopic-add on     Status: None   Collection Time: 08/05/15  6:30 PM  Result Value Ref Range   Squamous Epithelial / LPF RARE RARE   WBC, UA 0-2 <3 WBC/hpf   RBC / HPF 0-2 <3 RBC/hpf   Bacteria, UA RARE RARE   Urine-Other YEAST   CBC     Status: Abnormal   Collection Time: 08/05/15  8:28 PM  Result Value Ref Range   WBC 14.2 (H) 4.0 - 10.5 K/uL   RBC 3.61 (L) 3.87 - 5.11 MIL/uL   Hemoglobin 11.4 (L) 12.0 - 15.0 g/dL   HCT 16.1 (L) 09.6 - 04.5 %   MCV 92.5 78.0 - 100.0 fL   MCH 31.6 26.0 - 34.0 pg   MCHC 34.1 30.0 - 36.0 g/dL   RDW 40.9 81.1 - 91.4 %   Platelets 281 150 - 400 K/uL  Comprehensive metabolic panel     Status: Abnormal   Collection Time: 08/05/15  8:28 PM  Result Value Ref Range   Sodium 133 (L) 135 - 145 mmol/L   Potassium 3.5 3.5 - 5.1 mmol/L   Chloride 101 101 - 111 mmol/L   CO2 23 22 - 32 mmol/L   Glucose, Bld 178 (H) 65 - 99 mg/dL   BUN 10 6 - 20 mg/dL   Creatinine, Ser 7.82 0.44 - 1.00 mg/dL   Calcium 9.1 8.9 - 95.6 mg/dL   Total Protein 6.9 6.5 - 8.1 g/dL   Albumin 2.9 (L) 3.5 - 5.0 g/dL   AST 14  (L) 15 - 41 U/L   ALT 15 14 - 54 U/L   Alkaline Phosphatase 72 38 - 126 U/L   Total Bilirubin 0.5 0.3 - 1.2 mg/dL   GFR calc non Af Amer >60 >60 mL/min   GFR calc Af Amer >60 >60 mL/min   Anion gap 9 5 - 15      Imaging:  No results found.  MAU Course/MDM: I have ordered labs and reviewed results.  Consult Dr Ambrose Mantle and reviewed assessment and labs. Pt stable at time of discharge.  Assessment: 1. Tachycardia   2. Prior poor obstetrical history, antepartum, second trimester   3. Diabetes mellitus complicating pregnancy, antepartum, second trimester     Plan: Discharge home Labor precautions and fetal kick counts F/U with endocrinology tomorrow as scheduled      Follow-up Information    Follow up with Bing Plume, MD.   Specialty:  Obstetrics and Gynecology   Why:  As scheduled   Contact information:   [address] [place] [address] [phone]       Follow up with THE [facility] [place] [facility]ERN[facility] ADMISSIONS.   Why:  As needed for emergencies   Contact information:   [address] [id number] mc [place] [address] [phone]       Medication List    TAKE these medications        calcium carbonate 500 MG chewable tablet  Commonly known as:  TUMS -  dosed in mg elemental calcium  Chew 2 tablets by mouth daily as needed for indigestion or heartburn.     EPINEPHrine 0.3 mg/0.3 mL Soaj injection  Commonly known as:  EPI-PEN  Inject into the muscle once.     insulin lispro 100 UNIT/ML injection  Commonly known as:  HUMALOG  Inject 1.5 Units into the skin every hour. Delivered via an insulin pump.     ONE TOUCH ULTRA TEST test strip  Generic drug:  glucose blood  USE TO CHECK BLOOD SUGAR 6 TO 8 TIMES A DAY     glucose blood test strip  Commonly known as:  ONETOUCH VERIO  Check blood sugar 10 x daily     PRENATAL VITAMIN PO  Take 2 tablets by mouth daily.        Sharen Counter Certified Nurse-Midwife 08/05/2015 9:25 PM

## 2015-08-05 NOTE — Discharge Instructions (Signed)
Nonspecific Tachycardia  Tachycardia is a faster than normal heartbeat (more than 100 beats per minute). In adults, the heart normally beats between 60 and 100 times a minute. A fast heartbeat may be a normal response to exercise or stress. It does not necessarily mean that something is wrong. However, sometimes when your heart beats too fast it may not be able to pump enough blood to the rest of your body. This can result in chest pain, shortness of breath, dizziness, and even fainting. Nonspecific tachycardia means that the specific cause or pattern of your tachycardia is unknown.  CAUSES   Tachycardia may be harmless or it may be due to a more serious underlying cause. Possible causes of tachycardia include:  · Exercise or exertion.  · Fever.  · Pain or injury.  · Infection.  · Loss of body fluids (dehydration).  · Overactive thyroid.  · Lack of red blood cells (anemia).  · Anxiety and stress.  · Alcohol.  · Caffeine.  · Tobacco products.  · Diet pills.  · Illegal drugs.  · Heart disease.  SYMPTOMS  · Rapid or irregular heartbeat (palpitations).  · Suddenly feeling your heart beating (cardiac awareness).  · Dizziness.  · Tiredness (fatigue).  · Shortness of breath.  · Chest pain.  · Nausea.  · Fainting.  DIAGNOSIS   Your caregiver will perform a physical exam and take your medical history. In some cases, a heart specialist (cardiologist) may be consulted. Your caregiver may also order:  · Blood tests.  · Electrocardiography. This test records the electrical activity of your heart.  · A heart monitoring test.  TREATMENT   Treatment will depend on the likely cause of your tachycardia. The goal is to treat the underlying cause of your tachycardia. Treatment methods may include:  · Replacement of fluids or blood through an intravenous (IV) tube for moderate to severe dehydration or anemia.  · New medicines or changes in your current medicines.  · Diet and lifestyle changes.  · Treatment for certain  infections.  · Stress relief or relaxation methods.  HOME CARE INSTRUCTIONS   · Rest.  · Drink enough fluids to keep your urine clear or pale yellow.  · Do not smoke.  · Avoid:  ¨ Caffeine.  ¨ Tobacco.  ¨ Alcohol.  ¨ Chocolate.  ¨ Stimulants such as over-the-counter diet pills or pills that help you stay awake.  ¨ Situations that cause anxiety or stress.  ¨ Illegal drugs such as marijuana, phencyclidine (PCP), and cocaine.  · Only take medicine as directed by your caregiver.  · Keep all follow-up appointments as directed by your caregiver.  SEEK IMMEDIATE MEDICAL CARE IF:   · You have pain in your chest, upper arms, jaw, or neck.  · You become weak, dizzy, or feel faint.  · You have palpitations that will not go away.  · You vomit, have diarrhea, or pass blood in your stool.  · Your skin is cool, pale, and wet.  · You have a fever that will not go away with rest, fluids, and medicine.  MAKE SURE YOU:   · Understand these instructions.  · Will watch your condition.  · Will get help right away if you are not doing well or get worse.  Document Released: 12/30/2004 Document Revised: 02/14/2012 Document Reviewed: 11/02/2011  ExitCare® Patient Information ©2015 [employer]. This information is not intended to replace advice given to you by your health care provider. Make sure you discuss any questions   you have with your health care provider.

## 2015-08-05 NOTE — MAU Provider Note (Signed)
History     CSN: [id number]  Arrival date and time: 08/05/15 1819   First Provider Initiated Contact with Patient 08/05/15 1937      Chief Complaint  Patient presents with  . Hypertension  . Tachycardia   HPI [name] 24 y.o. [phone] @ [redacted]w[redacted]d presents from her pcp's office after being seen for possible strep throat. She states she felt SOB and felt her heart racing.Denies vaginal bleeding, LOF, reports positive fetal movement. Pt is an IDDM with pump for blood glucose control.   Past Medical History  Diagnosis Date  . Type 1 diabetes mellitus in patient age 72-19 years with HbA1C goal below 7.5   . Hypoglycemia associated with diabetes   . Goiter   . Diabetic peripheral neuropathy associated with type 1 diabetes mellitus   . Type 1 diabetes mellitus with diabetic autonomic neuropathy   . Diabetic peripheral angiopathy   . Tachycardia   . Febrile seizures     Seizures occurred at about 55/-65 months of age.  . Diabetes mellitus type I   . HSV-1 infection     Past Surgical History  Procedure Laterality Date  . Renal biopsy at age 89    . Indecision and drainage of staphylococcal facial abscess    . Abcess drainage      Family History  Problem Relation Age of Onset  . Hypertension Father   . Drug abuse Father   . Alcohol abuse Father   . Heart disease Maternal Grandmother   . Hyperlipidemia Maternal Grandmother   . Heart disease Paternal Grandfather   . Alcohol abuse Paternal Grandfather   . Hyperlipidemia Paternal Grandfather   . Hyperlipidemia Maternal Grandfather   . Hyperlipidemia Paternal Grandmother     Social History  Substance Use Topics  . Smoking status: Never Smoker   . Smokeless tobacco: Never Used  . Alcohol Use: No    Allergies:  Allergies  Allergen Reactions  . Pollen Extract Hives, Shortness Of Breath and Swelling    Extreme problems with seasonal allergies (carries an epi-pen for them)  . Sulfa Antibiotics Hives and Nausea And  Vomiting    Prescriptions prior to admission  Medication Sig Dispense Refill Last Dose  . calcium carbonate (TUMS - DOSED IN MG ELEMENTAL CALCIUM) 500 MG chewable tablet Chew 2 tablets by mouth daily as needed for indigestion or heartburn.   Past Month at Unknown time  . EPINEPHrine (EPI-PEN) 0.3 mg/0.3 mL SOAJ injection Inject into the muscle once.   rescue  . glucose blood (ONETOUCH VERIO) test strip Check blood sugar 10 x daily 200 each 4 Not Taking  . insulin lispro (HUMALOG) 100 UNIT/ML injection Inject 1.5 Units into the skin every hour. Delivered via an insulin pump.   08/05/2015 at 1800  . ONE TOUCH ULTRA TEST test strip USE TO CHECK BLOOD SUGAR 6 TO 8 TIMES A DAY 1 each 6 Taking  . Prenatal Vit-Fe Fumarate-FA (PRENATAL VITAMIN PO) Take 2 tablets by mouth daily.    08/04/2015 at Unknown time  . [DISCONTINUED] ibuprofen (ADVIL,MOTRIN) 600 MG tablet Take 1 tablet (600 mg total) by mouth every 6 (six) hours. (Patient not taking: Reported on 08/05/2015) 30 tablet 0 Not Taking at Unknown time  . [DISCONTINUED] NOVOLOG FLEXPEN 100 UNIT/ML SOPN FlexPen INJECT 25 UNITS INTO THE SKIN 4 TIMES A DAY (BEFORE MEALS AND AT BEDTIME) (Patient not taking: Reported on 08/05/2015) 15 mL 0 Not Taking at Unknown time    Review of Systems  Constitutional:  Negative for fever.  Respiratory: Positive for shortness of breath.   Cardiovascular: Positive for palpitations.  All other systems reviewed and are negative.  Physical Exam   Blood pressure 127/87, pulse 115, resp. rate 80, height 5\' 1"  (1.549 m), weight 68.04 kg (150 lb), last menstrual period 01/23/2015, SpO2 96 %, unknown if currently breastfeeding.  Physical Exam  Nursing note and vitals reviewed. Constitutional: She is oriented to person, place, and time. She appears well-developed and well-nourished. No distress.  HENT:  Head: Normocephalic.  Cardiovascular: Normal rate.   Respiratory: Effort normal. No respiratory distress.  GI: Soft.   Musculoskeletal: Normal range of motion.  Neurological: She is alert and oriented to person, place, and time.  Skin: Skin is warm and dry.  Psychiatric: She has a normal mood and affect. Her behavior is normal. Judgment and thought content normal.    MAU Course  Procedures  MDM Pending labs. Turned care over to [facility] and Plan    Clemmons,Lori Grissett 08/05/2015, 7:43 PM

## 2015-08-06 NOTE — Progress Notes (Signed)
This patient is no longer followed by Dr. Fransico Michael. Not seen since 2014.

## 2015-08-18 ENCOUNTER — Encounter (HOSPITAL_COMMUNITY): Payer: Self-pay | Admitting: *Deleted

## 2015-08-18 ENCOUNTER — Inpatient Hospital Stay (HOSPITAL_COMMUNITY)
Admission: AD | Admit: 2015-08-18 | Discharge: 2015-08-18 | Disposition: A | Discharge status: Home / Self Care | Payer: PRIVATE HEALTH INSURANCE | Source: Ambulatory Visit | Attending: Obstetrics and Gynecology | Admitting: Obstetrics and Gynecology

## 2015-08-18 DIAGNOSIS — Z3A29 29 weeks gestation of pregnancy: Secondary | ICD-10-CM | POA: Diagnosis not present

## 2015-08-18 DIAGNOSIS — O24013 Pre-existing diabetes mellitus, type 1, in pregnancy, third trimester: Secondary | ICD-10-CM | POA: Insufficient documentation

## 2015-08-18 DIAGNOSIS — E109 Type 1 diabetes mellitus without complications: Secondary | ICD-10-CM | POA: Diagnosis not present

## 2015-08-18 DIAGNOSIS — Z794 Long term (current) use of insulin: Secondary | ICD-10-CM | POA: Insufficient documentation

## 2015-08-18 DIAGNOSIS — O4703 False labor before 37 completed weeks of gestation, third trimester: Secondary | ICD-10-CM | POA: Diagnosis not present

## 2015-08-18 DIAGNOSIS — E1040 Type 1 diabetes mellitus with diabetic neuropathy, unspecified: Secondary | ICD-10-CM | POA: Insufficient documentation

## 2015-08-18 DIAGNOSIS — O133 Gestational [pregnancy-induced] hypertension without significant proteinuria, third trimester: Secondary | ICD-10-CM

## 2015-08-18 DIAGNOSIS — O479 False labor, unspecified: Secondary | ICD-10-CM

## 2015-08-18 LAB — CBC
HEMATOCRIT: 31.4 % — AB (ref 36.0–46.0)
Hemoglobin: 10.8 g/dL — ABNORMAL LOW (ref 12.0–15.0)
MCH: 31 pg (ref 26.0–34.0)
MCHC: 34.4 g/dL (ref 30.0–36.0)
MCV: 90.2 fL (ref 78.0–100.0)
PLATELETS: 314 10*3/uL (ref 150–400)
RBC: 3.48 MIL/uL — ABNORMAL LOW (ref 3.87–5.11)
RDW: 12.2 % (ref 11.5–15.5)
WBC: 13.5 10*3/uL — ABNORMAL HIGH (ref 4.0–10.5)

## 2015-08-18 LAB — PROTEIN / CREATININE RATIO, URINE
Creatinine, Urine: 25 mg/dL
Protein Creatinine Ratio: 1.4 mg/mg{Cre} — ABNORMAL HIGH (ref 0.00–0.15)
TOTAL PROTEIN, URINE: 35 mg/dL

## 2015-08-18 LAB — COMPREHENSIVE METABOLIC PANEL
ALT: 17 U/L (ref 14–54)
AST: 17 U/L (ref 15–41)
Albumin: 2.7 g/dL — ABNORMAL LOW (ref 3.5–5.0)
Alkaline Phosphatase: 104 U/L (ref 38–126)
Anion gap: 17 — ABNORMAL HIGH (ref 5–15)
BILIRUBIN TOTAL: 0.5 mg/dL (ref 0.3–1.2)
BUN: 12 mg/dL (ref 6–20)
CHLORIDE: 94 mmol/L — AB (ref 101–111)
CO2: 14 mmol/L — ABNORMAL LOW (ref 22–32)
Calcium: 8.7 mg/dL — ABNORMAL LOW (ref 8.9–10.3)
Creatinine, Ser: 0.77 mg/dL (ref 0.44–1.00)
GFR calc non Af Amer: >60 mL/min (ref >60)
Glucose, Bld: 374 mg/dL — ABNORMAL HIGH (ref 65–99)
Potassium: 3.7 mmol/L (ref 3.5–5.1)
Sodium: 125 mmol/L — ABNORMAL LOW (ref 135–145)
TOTAL PROTEIN: 6.7 g/dL (ref 6.5–8.1)

## 2015-08-18 LAB — URINALYSIS, ROUTINE W REFLEX MICROSCOPIC
Bilirubin Urine: NEGATIVE
Leukocytes, UA: NEGATIVE
Nitrite: NEGATIVE
PH: 5.5 (ref 5.0–8.0)
PROTEIN: NEGATIVE mg/dL
Specific Gravity, Urine: 1.02 (ref 1.005–1.030)
Urobilinogen, UA: 0.2 mg/dL (ref 0.0–1.0)

## 2015-08-18 LAB — URINE MICROSCOPIC-ADD ON

## 2015-08-18 LAB — GLUCOSE, CAPILLARY: Glucose-Capillary: 222 mg/dL — ABNORMAL HIGH (ref 65–99)

## 2015-08-18 MED ORDER — BETAMETHASONE SOD PHOS & ACET 6 (3-3) MG/ML IJ SUSP
12.0000 mg | Freq: Once | INTRAMUSCULAR | Status: AC
  Administered 2015-08-18: 12 mg via INTRAMUSCULAR
  Filled 2015-08-18: qty 2

## 2015-08-18 NOTE — MAU Provider Note (Signed)
History     CSN: [id number]  Arrival date and time: 08/18/15 2045   First Provider Initiated Contact with Patient 08/18/15 2146      No chief complaint on file.  HPI Comments: [name] is a a 24 y.o.G2P0101 at [redacted]w[redacted]d who presents today with contractions. She states that they are about every 5-10 mins depending on her activity. She denies any VB or LOF. She states that that the fetus has been active, but slightly less than normal. She denies any blood pressure problems with this pregnancy. She has an insulin pump for her daibetes with humalog through her pump. She states that her blood sugars have been in the lower 100's. She has had nausea. She denies any upper abdominal or RUQ pain.   Abdominal Pain This is a new problem. The current episode started today. The onset quality is gradual. The problem has been gradually worsening. The pain is located in the suprapubic region. The pain is at a severity of 5/10. The quality of the pain is cramping and sharp (pressure ). Associated symptoms include nausea. Pertinent negatives include no constipation, diarrhea, dysuria, fever, frequency or vomiting. Nothing aggravates the pain. The pain is relieved by certain positions (sitting and resting ).    Past Medical History  Diagnosis Date  . Type 1 diabetes mellitus in patient age 84-19 years with HbA1C goal below 7.5   . Hypoglycemia associated with diabetes   . Goiter   . Diabetic peripheral neuropathy associated with type 1 diabetes mellitus   . Type 1 diabetes mellitus with diabetic autonomic neuropathy   . Diabetic peripheral angiopathy   . Tachycardia   . Febrile seizures     Seizures occurred at about 38/-50 months of age.  . Diabetes mellitus type I   . HSV-1 infection     Past Surgical History  Procedure Laterality Date  . Renal biopsy at age 25    . Indecision and drainage of staphylococcal facial abscess    . Abcess drainage      Family History  Problem Relation Age of  Onset  . Hypertension Father   . Drug abuse Father   . Alcohol abuse Father   . Heart disease Maternal Grandmother   . Hyperlipidemia Maternal Grandmother   . Heart disease Paternal Grandfather   . Alcohol abuse Paternal Grandfather   . Hyperlipidemia Paternal Grandfather   . Hyperlipidemia Maternal Grandfather   . Hyperlipidemia Paternal Grandmother     Social History  Substance Use Topics  . Smoking status: Never Smoker   . Smokeless tobacco: Never Used  . Alcohol Use: No    Allergies:  Allergies  Allergen Reactions  . Pollen Extract Hives, Shortness Of Breath and Swelling    Extreme problems with seasonal allergies (carries an epi-pen for them)  . Adhesive [Tape] Hives    Pt still uses adhesives although she gets hives  . Sulfa Antibiotics Hives and Nausea And Vomiting    Prescriptions prior to admission  Medication Sig Dispense Refill Last Dose  . acetaminophen (TYLENOL) 500 MG tablet Take 1,000 mg by mouth every 6 (six) hours as needed for headache.   08/18/2015 at Unknown time  . calcium carbonate (TUMS - DOSED IN MG ELEMENTAL CALCIUM) 500 MG chewable tablet Chew 2 tablets by mouth daily as needed for indigestion or heartburn.   Past Week at Unknown time  . Prenatal Vit-Fe Fumarate-FA (PRENATAL VITAMIN PO) Take 2 tablets by mouth daily.    08/17/2015 at Unknown time  .  terconazole (TERAZOL 3) 0.8 % vaginal cream Place 1 applicator vaginally at bedtime.   08/17/2015 at Unknown time  . EPINEPHrine (EPI-PEN) 0.3 mg/0.3 mL SOAJ injection Inject into the muscle once.   rescue at Unknown time  . glucose blood (ONETOUCH VERIO) test strip Check blood sugar 10 x daily 200 each 4 Not Taking  . insulin lispro (HUMALOG) 100 UNIT/ML injection Inject 1.5 Units into the skin every hour. Delivered via an insulin pump.   08/05/2015 at 1800  . ONE TOUCH ULTRA TEST test strip USE TO CHECK BLOOD SUGAR 6 TO 8 TIMES A DAY 1 each 6 Taking    Review of Systems  Constitutional: Negative for  fever.  Gastrointestinal: Positive for nausea and abdominal pain. Negative for vomiting, diarrhea and constipation.  Genitourinary: Negative for dysuria, urgency and frequency.   Physical Exam   Blood pressure 146/103, pulse 105, temperature 98.7 F (37.1 C), temperature source Oral, resp. rate 16, height 5\' 1"  (1.549 m), weight 71.668 kg (158 lb), last menstrual period 01/23/2015, SpO2 100 %, unknown if currently breastfeeding.  Physical Exam  Nursing note and vitals reviewed. Constitutional: She is oriented to person, place, and time. She appears well-developed and well-nourished. No distress.  Cardiovascular: Normal rate.   Respiratory: Effort normal.  GI: Soft. There is no tenderness. There is no rebound.  Neurological: She is alert and oriented to person, place, and time.  Skin: Skin is warm and dry.  Psychiatric: She has a normal mood and affect.    MAU Course  Procedures  MDM 2311: D/W Dr. Jackelyn Knife, will ask patient, how she would correct for a blood glucose of 374, and then call back Patient states that just prior to arrival her blood glucose was 200, and she had already corrected for that. Will check a CBG at this time, and see if there has been improvement. Patient states that in stressful situations her blood glucose will spike. 2323: CBG is 222 patient will give herself 4U of humalog with her pump.  2326: D/W Dr. Jackelyn Knife: Will give BMZ now and have patient return tomorrow for second dose. Call for the office first thing in the morning for B/P check and evaluation in the office.   Assessment and Plan   1. Transient hypertension of pregnancy in third trimester   2. Braxton Hicks contractions    DC home Comfort measures reviewed  3rd Trimester precautions  PTL precautions  Fetal kick counts RX: none, BMZ given here. Return in 24 hours for repeat dose.  Return to MAU as needed FU with OB tomorrow   Follow-up Information    Follow up with THE [facility] MATERNITY ADMISSIONS.   Why:  tomorrow 08/19/15 around 2200    Contact information:   [address] [id number] [name] [name] [address] [phone]      Follow up with MEISINGER,TODD D, MD In 1 day.   Specialty:  Obstetrics and Gynecology   Why:  call the office for an appointment tomorrow    Contact information:   [address], [address] [place] [address] [phone]         Tawnya Crook 08/18/2015, 9:51 PM

## 2015-08-18 NOTE — MAU Note (Signed)
Pt states she has been having contractions on/off for 2 weeks, this pm they became more consistent and painful. Denies bleeding.

## 2015-08-18 NOTE — MAU Note (Signed)
CBG 222, pt taking 4 units of humulog in pump. Zorita Pang CNM made aware and Dr. Jackelyn Knife to be notified.

## 2015-08-18 NOTE — Discharge Instructions (Signed)

## 2015-08-18 NOTE — MAU Note (Signed)
Urine sent to lab @ 2109

## 2015-08-19 ENCOUNTER — Inpatient Hospital Stay (HOSPITAL_COMMUNITY)
Admission: AD | Admit: 2015-08-19 | Discharge: 2015-08-19 | Disposition: A | Discharge status: Home / Self Care | Payer: PRIVATE HEALTH INSURANCE | Source: Ambulatory Visit | Attending: Obstetrics and Gynecology | Admitting: Obstetrics and Gynecology

## 2015-08-19 DIAGNOSIS — Z3A29 29 weeks gestation of pregnancy: Secondary | ICD-10-CM | POA: Insufficient documentation

## 2015-08-19 MED ORDER — BETAMETHASONE SOD PHOS & ACET 6 (3-3) MG/ML IJ SUSP
12.0000 mg | Freq: Once | INTRAMUSCULAR | Status: AC
  Administered 2015-08-19: 12 mg via INTRAMUSCULAR
  Filled 2015-08-19: qty 2

## 2015-08-19 NOTE — MAU Note (Signed)
Pt presents for second BMZ injection. Denies pain or bleeding. Reports good fetal movement.

## 2015-08-19 NOTE — Progress Notes (Signed)
FHT from 9-12 reviewed.  Reactive NST, occasional ctx

## 2015-09-04 ENCOUNTER — Inpatient Hospital Stay (HOSPITAL_COMMUNITY)
Admission: AD | Admit: 2015-09-04 | Discharge: 2015-09-11 | DRG: 774 | Disposition: A | Discharge status: Home / Self Care | Payer: PRIVATE HEALTH INSURANCE | Source: Ambulatory Visit | Attending: Obstetrics and Gynecology | Admitting: Obstetrics and Gynecology

## 2015-09-04 ENCOUNTER — Encounter (HOSPITAL_COMMUNITY): Payer: Self-pay | Admitting: *Deleted

## 2015-09-04 DIAGNOSIS — E1042 Type 1 diabetes mellitus with diabetic polyneuropathy: Secondary | ICD-10-CM

## 2015-09-04 DIAGNOSIS — O1494 Unspecified pre-eclampsia, complicating childbirth: Secondary | ICD-10-CM | POA: Diagnosis present

## 2015-09-04 DIAGNOSIS — E109 Type 1 diabetes mellitus without complications: Secondary | ICD-10-CM

## 2015-09-04 DIAGNOSIS — O169 Unspecified maternal hypertension, unspecified trimester: Secondary | ICD-10-CM | POA: Diagnosis present

## 2015-09-04 DIAGNOSIS — Z3490 Encounter for supervision of normal pregnancy, unspecified, unspecified trimester: Secondary | ICD-10-CM

## 2015-09-04 DIAGNOSIS — Z794 Long term (current) use of insulin: Secondary | ICD-10-CM

## 2015-09-04 DIAGNOSIS — O26833 Pregnancy related renal disease, third trimester: Secondary | ICD-10-CM | POA: Diagnosis present

## 2015-09-04 DIAGNOSIS — O1490 Unspecified pre-eclampsia, unspecified trimester: Secondary | ICD-10-CM

## 2015-09-04 DIAGNOSIS — Z141 Cystic fibrosis carrier: Secondary | ICD-10-CM

## 2015-09-04 DIAGNOSIS — O2402 Pre-existing diabetes mellitus, type 1, in childbirth: Secondary | ICD-10-CM | POA: Diagnosis present

## 2015-09-04 DIAGNOSIS — I10 Essential (primary) hypertension: Secondary | ICD-10-CM | POA: Diagnosis present

## 2015-09-04 DIAGNOSIS — E11649 Type 2 diabetes mellitus with hypoglycemia without coma: Secondary | ICD-10-CM

## 2015-09-04 DIAGNOSIS — O9832 Other infections with a predominantly sexual mode of transmission complicating childbirth: Secondary | ICD-10-CM | POA: Diagnosis present

## 2015-09-04 DIAGNOSIS — A6000 Herpesviral infection of urogenital system, unspecified: Secondary | ICD-10-CM | POA: Diagnosis present

## 2015-09-04 DIAGNOSIS — Z8249 Family history of ischemic heart disease and other diseases of the circulatory system: Secondary | ICD-10-CM | POA: Diagnosis not present

## 2015-09-04 DIAGNOSIS — Z3A31 31 weeks gestation of pregnancy: Secondary | ICD-10-CM

## 2015-09-04 DIAGNOSIS — E049 Nontoxic goiter, unspecified: Secondary | ICD-10-CM

## 2015-09-04 DIAGNOSIS — E1151 Type 2 diabetes mellitus with diabetic peripheral angiopathy without gangrene: Secondary | ICD-10-CM

## 2015-09-04 DIAGNOSIS — Q913 Trisomy 18, unspecified: Secondary | ICD-10-CM

## 2015-09-04 DIAGNOSIS — R809 Proteinuria, unspecified: Secondary | ICD-10-CM

## 2015-09-04 DIAGNOSIS — N028 Recurrent and persistent hematuria with other morphologic changes: Secondary | ICD-10-CM | POA: Diagnosis present

## 2015-09-04 DIAGNOSIS — O1493 Unspecified pre-eclampsia, third trimester: Secondary | ICD-10-CM

## 2015-09-04 DIAGNOSIS — O09293 Supervision of pregnancy with other poor reproductive or obstetric history, third trimester: Secondary | ICD-10-CM

## 2015-09-04 DIAGNOSIS — E1043 Type 1 diabetes mellitus with diabetic autonomic (poly)neuropathy: Secondary | ICD-10-CM

## 2015-09-04 DIAGNOSIS — R5600 Simple febrile convulsions: Secondary | ICD-10-CM

## 2015-09-04 DIAGNOSIS — Z363 Encounter for antenatal screening for malformations: Secondary | ICD-10-CM

## 2015-09-04 DIAGNOSIS — R000 Tachycardia, unspecified: Secondary | ICD-10-CM

## 2015-09-04 DIAGNOSIS — Z1389 Encounter for screening for other disorder: Secondary | ICD-10-CM

## 2015-09-04 DIAGNOSIS — Z3A32 32 weeks gestation of pregnancy: Secondary | ICD-10-CM

## 2015-09-04 DIAGNOSIS — O24313 Unspecified pre-existing diabetes mellitus in pregnancy, third trimester: Secondary | ICD-10-CM

## 2015-09-04 HISTORY — DX: Thyrotoxicosis, unspecified without thyrotoxic crisis or storm: E05.90

## 2015-09-04 LAB — URINALYSIS, ROUTINE W REFLEX MICROSCOPIC
BILIRUBIN URINE: NEGATIVE
Glucose, UA: 100 mg/dL — AB
Ketones, ur: NEGATIVE mg/dL
Leukocytes, UA: NEGATIVE
NITRITE: NEGATIVE
UROBILINOGEN UA: 0.2 mg/dL (ref 0.0–1.0)
pH: 7 (ref 5.0–8.0)

## 2015-09-04 LAB — URINE MICROSCOPIC-ADD ON

## 2015-09-04 LAB — COMPREHENSIVE METABOLIC PANEL
ALBUMIN: 2.4 g/dL — AB (ref 3.5–5.0)
ALT: 17 U/L (ref 14–54)
ANION GAP: 9 (ref 5–15)
AST: 18 U/L (ref 15–41)
Alkaline Phosphatase: 139 U/L — ABNORMAL HIGH (ref 38–126)
BUN: 13 mg/dL (ref 6–20)
CHLORIDE: 104 mmol/L (ref 101–111)
CO2: 22 mmol/L (ref 22–32)
Calcium: 8.7 mg/dL — ABNORMAL LOW (ref 8.9–10.3)
Creatinine, Ser: 0.62 mg/dL (ref 0.44–1.00)
GFR calc Af Amer: >60 mL/min (ref >60)
GFR calc non Af Amer: >60 mL/min (ref >60)
GLUCOSE: 101 mg/dL — AB (ref 65–99)
POTASSIUM: 3.8 mmol/L (ref 3.5–5.1)
Sodium: 135 mmol/L (ref 135–145)
Total Bilirubin: 0.5 mg/dL (ref 0.3–1.2)
Total Protein: 6.6 g/dL (ref 6.5–8.1)

## 2015-09-04 LAB — CBC
HCT: 32.2 % — ABNORMAL LOW (ref 36.0–46.0)
Hemoglobin: 10.6 g/dL — ABNORMAL LOW (ref 12.0–15.0)
MCH: 29.4 pg (ref 26.0–34.0)
MCHC: 32.9 g/dL (ref 30.0–36.0)
MCV: 89.4 fL (ref 78.0–100.0)
PLATELETS: 274 10*3/uL (ref 150–400)
RBC: 3.6 MIL/uL — ABNORMAL LOW (ref 3.87–5.11)
RDW: 12.8 % (ref 11.5–15.5)
WBC: 10.9 10*3/uL — AB (ref 4.0–10.5)

## 2015-09-04 LAB — GLUCOSE, CAPILLARY
GLUCOSE-CAPILLARY: 126 mg/dL — AB (ref 65–99)
Glucose-Capillary: 66 mg/dL (ref 65–99)

## 2015-09-04 LAB — PROTEIN / CREATININE RATIO, URINE
CREATININE, URINE: 126 mg/dL
PROTEIN CREATININE RATIO: 5.6 mg/mg{creat} — AB (ref 0.00–0.15)
TOTAL PROTEIN, URINE: 705 mg/dL

## 2015-09-04 LAB — URIC ACID: URIC ACID, SERUM: 5.9 mg/dL (ref 2.3–6.6)

## 2015-09-04 LAB — LACTATE DEHYDROGENASE: LDH: 197 U/L — ABNORMAL HIGH (ref 98–192)

## 2015-09-04 MED ORDER — INSULIN PUMP
Freq: Three times a day (TID) | SUBCUTANEOUS | Status: DC
  Administered 2015-09-05: 28.66 via SUBCUTANEOUS
  Administered 2015-09-05: 13.1 via SUBCUTANEOUS
  Administered 2015-09-05: 20:00:00 via SUBCUTANEOUS
  Administered 2015-09-06: 27.4 via SUBCUTANEOUS
  Administered 2015-09-06: 10 via SUBCUTANEOUS
  Administered 2015-09-06: 21.47 via SUBCUTANEOUS
  Administered 2015-09-07: 27.9 via SUBCUTANEOUS
  Administered 2015-09-07: 16.2 via SUBCUTANEOUS
  Administered 2015-09-07: 21.7 via SUBCUTANEOUS
  Administered 2015-09-08: 10.7 via SUBCUTANEOUS
  Filled 2015-09-04: qty 1

## 2015-09-04 MED ORDER — OXYCODONE-ACETAMINOPHEN 5-325 MG PO TABS
1.0000 | ORAL_TABLET | Freq: Four times a day (QID) | ORAL | Status: DC | PRN
  Administered 2015-09-04 – 2015-09-07 (×4): 1 via ORAL
  Filled 2015-09-04 (×4): qty 1

## 2015-09-04 MED ORDER — PRENATAL MULTIVITAMIN CH
1.0000 | ORAL_TABLET | Freq: Every day | ORAL | Status: DC
  Administered 2015-09-05 – 2015-09-07 (×3): 1 via ORAL
  Filled 2015-09-04 (×3): qty 1

## 2015-09-04 MED ORDER — VALACYCLOVIR HCL 500 MG PO TABS
1000.0000 mg | ORAL_TABLET | Freq: Every day | ORAL | Status: DC
  Administered 2015-09-05 – 2015-09-08 (×4): 1000 mg via ORAL
  Filled 2015-09-04 (×5): qty 2

## 2015-09-04 MED ORDER — ACETAMINOPHEN 325 MG PO TABS
650.0000 mg | ORAL_TABLET | ORAL | Status: DC | PRN
  Administered 2015-09-05: 650 mg via ORAL
  Filled 2015-09-04: qty 2

## 2015-09-04 MED ORDER — DOCUSATE SODIUM 100 MG PO CAPS
100.0000 mg | ORAL_CAPSULE | Freq: Every day | ORAL | Status: DC
Start: 2015-09-04 — End: 2015-09-08
  Administered 2015-09-05 – 2015-09-08 (×4): 100 mg via ORAL
  Filled 2015-09-04 (×4): qty 1

## 2015-09-04 MED ORDER — LACTATED RINGERS IV SOLN
INTRAVENOUS | Status: DC
  Administered 2015-09-04 – 2015-09-10 (×4): via INTRAVENOUS

## 2015-09-04 MED ORDER — CALCIUM CARBONATE ANTACID 500 MG PO CHEW: 2.0000 | CHEWABLE_TABLET | ORAL | Status: DC | PRN

## 2015-09-04 MED ORDER — LABETALOL HCL 5 MG/ML IV SOLN
20.0000 mg | INTRAVENOUS | Status: DC | PRN
Start: 2015-09-04 — End: 2015-09-09
  Administered 2015-09-04: 20 mg via INTRAVENOUS
  Filled 2015-09-04: qty 4

## 2015-09-04 MED ORDER — ZOLPIDEM TARTRATE 5 MG PO TABS: 5.0000 mg | ORAL_TABLET | Freq: Every evening | ORAL | Status: DC | PRN

## 2015-09-04 MED ORDER — OXYCODONE-ACETAMINOPHEN 5-325 MG PO TABS
1.0000 | ORAL_TABLET | Freq: Once | ORAL | Status: AC
  Administered 2015-09-04: 1 via ORAL
  Filled 2015-09-04: qty 1

## 2015-09-04 MED ORDER — HYDRALAZINE HCL 20 MG/ML IJ SOLN: 10.0000 mg | Freq: Once | INTRAMUSCULAR | Status: DC | PRN

## 2015-09-04 NOTE — MAU Note (Addendum)
Sent from OB's office for hypertension eval; c/opain underneath R breast for past 2 days; Type I Diabetic with an insulin pump; elevated BPs; denies any vision changes or headache; has had some preterm labor with this pregnancy and has received betametasone;

## 2015-09-04 NOTE — MAU Provider Note (Signed)
Chief Complaint:  Hypertension   First Provider Initiated Contact with Patient 09/04/15 1140      HPI: [name] is a 24 y.o. G2P0101 at [redacted]w[redacted]d who presents to maternity admissions from [facility] for Pre-E eval. Has had elevated BP for that past few weeks, new moderate HA's x 2 weeks and RUQ pain x 3 days that got much worse last night. Was Rx'd Fioricet which provided partial relief. Mild HA now w/out meds. Always spill trace protein in urine due to IgA nephropathy. Has P: C ratio of 1.4 on 08/18/15. Has not done 24 hour urine this pregnancy. Has Pre-E w/ first pregnancy.   RUQ pain Location: RUQ Quality: sharp Severity: 8/10 in pain scale Duration: 3 days Context: None Timing: constant Modifying factors: Worse w/ mvmt, leaning forward Associated signs and symptoms: Neg for fever, , chills, nausea, vomiting. Positive for mild/moderate headache and elevated blood pressure over the past 2 weeks.  Headache Location: Generalized head Quality: Throbbing Severity: 2/10 in pain scale. Moderate at worst Duration: 2 weeks Context: Unsure Timing: Constant, but intermittent exacerbation Modifying factors: Some improvement with Fioricet. Associated signs and symptoms: Negative for fever, chills, vision changes, difficulties with speech or gait, weakness, sinus congestion or neck stiffness.   Denies contractions, leakage of fluid or vaginal bleeding. Good fetal movement.   Pregnancy Course: Complicated by type I diabetes on insulin pump. Has developed gestational hypertension over the past 2 weeks.    Past Medical History: Past Medical History  Diagnosis Date  . Type 1 diabetes mellitus in patient age 3-19 years with HbA1C goal below 7.5   . Hypoglycemia associated with diabetes   . Goiter   . Diabetic peripheral neuropathy associated with type 1 diabetes mellitus   . Type 1 diabetes mellitus with diabetic autonomic neuropathy   . Diabetic peripheral angiopathy   .  Tachycardia   . Diabetes mellitus type I   . HSV-1 infection   . Hyperthyroidism   . Febrile seizures     Seizures occurred at about 12/-44 months of age.    Past obstetric history: OB History  Gravida Para Term Preterm AB SAB TAB Ectopic Multiple Living  # Outcome Date GA Lbr Len/2nd Weight Sex Delivery Anes PTL Lv  2 Current           1 Preterm 12/08/13 [redacted]w[redacted]d 07:15 / 00:13 1 lb 11.5 oz (0.78 kg) M Vag-Spont EPI  Y      Past Surgical History: Past Surgical History  Procedure Laterality Date  . Renal biopsy at age 75    . Indecision and drainage of staphylococcal facial abscess    . Abcess drainage       Family History: Family History  Problem Relation Age of Onset  . Hypertension Father   . Drug abuse Father   . Alcohol abuse Father   . Heart disease Maternal Grandmother   . Hyperlipidemia Maternal Grandmother   . Heart disease Paternal Grandfather   . Alcohol abuse Paternal Grandfather   . Hyperlipidemia Paternal Grandfather   . Hyperlipidemia Maternal Grandfather   . Hyperlipidemia Paternal Grandmother     Social History: Social History  Substance Use Topics  . Smoking status: Never Smoker   . Smokeless tobacco: Never Used  . Alcohol Use: No    Allergies:  Allergies  Allergen Reactions  . Pollen Extract Hives, Shortness Of Breath and Swelling    Extreme problems  with seasonal allergies (carries an epi-pen for them)  . Adhesive [Tape] Hives    Pt still uses adhesives although she gets hives  . Sulfa Antibiotics Hives and Nausea And Vomiting    Meds:  Prescriptions prior to admission  Medication Sig Dispense Refill Last Dose  . acetaminophen (TYLENOL) 500 MG tablet Take 1,000 mg by mouth every 6 (six) hours as needed for headache.   08/18/2015 at Unknown time  . calcium carbonate (TUMS - DOSED IN MG ELEMENTAL CALCIUM) 500 MG chewable tablet Chew 2 tablets by mouth daily as needed for indigestion or heartburn.   Past Week at Unknown time   . EPINEPHrine (EPI-PEN) 0.3 mg/0.3 mL SOAJ injection Inject into the muscle once.   rescue at Unknown time  . glucose blood (ONETOUCH VERIO) test strip Check blood sugar 10 x daily 200 each 4 Not Taking  . insulin lispro (HUMALOG) 100 UNIT/ML injection Inject 1.5 Units into the skin every hour. Delivered via an insulin pump.   08/05/2015 at 1800  . ONE TOUCH ULTRA TEST test strip USE TO CHECK BLOOD SUGAR 6 TO 8 TIMES A DAY 1 each 6 Taking  . Prenatal Vit-Fe Fumarate-FA (PRENATAL VITAMIN PO) Take 2 tablets by mouth daily.    08/17/2015 at Unknown time  . terconazole (TERAZOL 3) 0.8 % vaginal cream Place 1 applicator vaginally at bedtime.   08/17/2015 at Unknown time    I have reviewed patient's Past Medical Hx, Surgical Hx, Family Hx, Social Hx, medications and allergies.   ROS:  Review of Systems  Constitutional: Negative for fever and chills.  HENT: Positive for facial swelling. Negative for congestion, ear pain and sinus pressure.   Eyes: Negative for photophobia and visual disturbance.  Cardiovascular: Positive for leg swelling.  Gastrointestinal: Positive for abdominal pain (right upper quadrant). Negative for nausea and vomiting.  Genitourinary: Negative for flank pain and vaginal bleeding.  Neurological: Negative for seizures.    Physical Exam   Patient Vitals for the past 24 hrs:  BP Temp Temp src Pulse Resp  09/04/15 1200 136/98 mmHg - - 111 -  09/04/15 1151 (!) 150/103 mmHg - - 116 -  09/04/15 1141 (!) 154/101 mmHg - - 116 -  09/04/15 1131 155/100 mmHg - - 115 -  09/04/15 1120 (!) 153/110 mmHg - - (!) 122 -  09/04/15 1110 (!) 145/102 mmHg - - 116 -  09/04/15 1101 147/98 mmHg - - 114 -  09/04/15 1051 (!) 149/104 mmHg 98.9 F (37.2 C) Oral 118 18  09/04/15 1050 (!) 142/104 mmHg - - 115 -   Constitutional: Well-developed, well-nourished female in no acute distress.  Head: Periorbital edema present. No congestion or rhinorrhea. Cardiovascular: Mild tachycardia. Respiratory:  normal effort GI: Abd soft, non-tender, gravid appropriate for gestational age. MS: Extremities nontender, 3+ pitting bilateral lower extremity edema. Bilateral edema of hands. Neurologic: Alert and oriented x 4. Deep tendon reflexes 3+, 1 beat clonus bilaterally  GU: Deferred    FHT:  Baseline 150 , moderate variability, accelerations present, no decelerations Contractions: Irregular, painless   Labs: Results for orders placed or performed during the hospital encounter of 09/04/15 (from the past 24 hour(s))  Protein / creatinine ratio, urine     Status: Abnormal   Collection Time: 09/04/15 10:30 AM  Result Value Ref Range   Creatinine, Urine 126.00 mg/dL   Total Protein, Urine 705 mg/dL   Protein Creatinine Ratio 5.60 (H) 0.00 - 0.15 mg/mg[Cre]  Urinalysis, Routine w reflex microscopic (not at  [facility])     Status: Abnormal   Collection Time: 09/04/15 10:30 AM  Result Value Ref Range   Color, Urine YELLOW YELLOW   APPearance CLEAR CLEAR   Specific Gravity, Urine >1.030 (H) 1.005 - 1.030   pH 7.0 5.0 - 8.0   Glucose, UA 100 (A) NEGATIVE mg/dL   Hgb urine dipstick TRACE (A) NEGATIVE   Bilirubin Urine NEGATIVE NEGATIVE   Ketones, ur NEGATIVE NEGATIVE mg/dL   Protein, ur >098 (A) NEGATIVE mg/dL   Urobilinogen, UA 0.2 0.0 - 1.0 mg/dL   Nitrite NEGATIVE NEGATIVE   Leukocytes, UA NEGATIVE NEGATIVE  Urine microscopic-add on     Status: Abnormal   Collection Time: 09/04/15 10:30 AM  Result Value Ref Range   Squamous Epithelial / LPF MANY (A) RARE   WBC, UA 0-2 <3 WBC/hpf   Bacteria, UA FEW (A) RARE   Casts GRANULAR CAST (A) NEGATIVE  CBC     Status: Abnormal   Collection Time: 09/04/15 10:40 AM  Result Value Ref Range   WBC 10.9 (H) 4.0 - 10.5 K/uL   RBC 3.60 (L) 3.87 - 5.11 MIL/uL   Hemoglobin 10.6 (L) 12.0 - 15.0 g/dL   HCT 11.9 (L) 14.7 - 82.9 %   MCV 89.4 78.0 - 100.0 fL   MCH 29.4 26.0 - 34.0 pg   MCHC 32.9 30.0 - 36.0 g/dL   RDW 56.2 13.0 - 86.5 %   Platelets 274 150 - 400  K/uL  Comprehensive metabolic panel     Status: Abnormal   Collection Time: 09/04/15 10:40 AM  Result Value Ref Range   Sodium 135 135 - 145 mmol/L   Potassium 3.8 3.5 - 5.1 mmol/L   Chloride 104 101 - 111 mmol/L   CO2 22 22 - 32 mmol/L   Glucose, Bld 101 (H) 65 - 99 mg/dL   BUN 13 6 - 20 mg/dL   Creatinine, Ser 7.84 0.44 - 1.00 mg/dL   Calcium 8.7 (L) 8.9 - 10.3 mg/dL   Total Protein 6.6 6.5 - 8.1 g/dL   Albumin 2.4 (L) 3.5 - 5.0 g/dL   AST 18 15 - 41 U/L   ALT 17 14 - 54 U/L   Alkaline Phosphatase 139 (H) 38 - 126 U/L   Total Bilirubin 0.5 0.3 - 1.2 mg/dL   GFR calc non Af Amer >60 >60 mL/min   GFR calc Af Amer >60 >60 mL/min   Anion gap 9 5 - 15  Uric acid     Status: None   Collection Time: 09/04/15 10:40 AM  Result Value Ref Range   Uric Acid, Serum 5.9 2.3 - 6.6 mg/dL  Lactate dehydrogenase     Status: Abnormal   Collection Time: 09/04/15 10:40 AM  Result Value Ref Range   LDH 197 (H) 98 - 192 U/L    Imaging:  No results found.  MAU Course: CBC, CMP, UA, protein creatinine ratio, IV, preeclampsia focused orders set. Labetalol when necessary for severe range blood pressures.  Discussed blood pressures, labs (severely elevated protein creatinine ratio, but otherwise normal) and patient exam with Dr. Senaida Ores. Will admit antenatal per 24-hour urine and to follow lab work.  One dose labetalol 20 mg IV given.  MDM: 24 year old patient of 31 weeks 6 days with new-onset right upper quadrant pain now meets criteria for preeclampsia. BP, blood work stable. Preterm fetus. Questionable whether headache is chronic or preeclampsia-related. Unclear etiology of right upper quadrant pain, but liver enzymes normal and unchanged. Per Dr.  Senaida Ores will try to delay delivery until 34 weeks she remained stable.   Assessment: Preeclampsia  Plan: Admit to antenatal per consult with Dr. Senaida Ores. Dr. Senaida Ores to assume care of patient.  [place], CNM 09/04/2015 11:17  AM

## 2015-09-04 NOTE — H&P (Signed)
[name] is a 24 y.o. female G2P0100 at 45 6/7 (EDD 10/31/15 by 8 week [name])  presenting for admission with diagnosis of preeclampsia.  Pt has had BP elevation since 08/18/15 to 140/90's with normal preeclamptic labs except for a progressively worsening proteinuria.  This is complicated by the fact that she has a h/o IGA nephropathy, however her urine protein has been negative the majority of the pregnancy until 08/08/15 when she started to spill 2+ to 4+ protein.  A protein:creatinine ratio was 1.4 on 08/18/15 and she was given betamethasone x 2 (9/12 and 9/13) but all other labs were WNL and BP only intermittently mildly elevated, so she has been managed carefully outpatient.  She c/o a right chest pain/tendernes this AM and was brought to the office where BP much higher at 150-170/100-105 and was spilling 4+ proteinuria.  A repeat protein:creatinine ratio was 5.6, progressing substantially.  Her LFT's and other labs were WNL, so she is admitted for close observation. Other prenatal issues are Type 1 DM which is well-controlled on insulin pump and managed by Dr.Kerr, fetal ECHO WNL Prior trisomy 21 delivery and baby died shortly after birth--Panorama WNL this pregnancy She has the h/o IGA nephropathy but creatinine is normal and no protein noted until 3rd trimester She has a h/o HSV with rare outbreaks, will begin valtrex suppression as delivery probably will occur in next several weeks She is a CF carrier, but FOB is negative Rubella non-immune  Maternal Medical History:  Prenatal complications: Pre-eclampsia.   Prenatal Complications - Diabetes: type 1. Diabetes is managed by insulin pump.      OB History    Gravida Para Term Preterm AB TAB SAB Ectopic Multiple Living   NSVD 1#11oz 30 weeks Trisomy 71, expired after birth  Past Medical History  Diagnosis Date  . Type 1 diabetes mellitus in patient age 86-19 years with HbA1C goal below 7.5   . Hypoglycemia associated  with diabetes   . Goiter   . Diabetic peripheral neuropathy associated with type 1 diabetes mellitus   . Type 1 diabetes mellitus with diabetic autonomic neuropathy   . Diabetic peripheral angiopathy   . Tachycardia   . Diabetes mellitus type I   . HSV-1 infection   . Hyperthyroidism   . Febrile seizures     Seizures occurred at about 76/-103 months of age.   Past Surgical History  Procedure Laterality Date  . Renal biopsy at age 60    . Indecision and drainage of staphylococcal facial abscess    . Abcess drainage     Family History: family history includes Alcohol abuse in her father and paternal grandfather; Drug abuse in her father; Heart disease in her maternal grandmother and paternal grandfather; Hyperlipidemia in her maternal grandfather, maternal grandmother, paternal grandfather, and paternal grandmother; Hypertension in her father. Social History:  reports that she has never smoked. She has never used smokeless tobacco. She reports that she does not drink alcohol or use illicit drugs.   Prenatal Transfer Tool  Maternal Diabetes: Yes:  Diabetes Type:  Insulin/Medication controlled Genetic Screening: Normal Maternal Ultrasounds/Referrals: Normal Fetal Ultrasounds or other Referrals:  Fetal echo Maternal Substance Abuse:  No Significant Maternal Medications:  None Significant Maternal Lab Results:  None Other Comments:  None  Review of Systems  Cardiovascular: Positive for chest pain.  Neurological: Negative for headaches.      Blood pressure 137/95, pulse 111,  temperature 98.9 F (37.2 C), temperature source Oral, resp. rate 20, height  (1.549 m), weight 75.864 kg (167 lb 4 oz), last menstrual period 01/23/2015, unknown if currently breastfeeding. Maternal Exam:  Introitus: Normal vulva. Normal vagina.    Physical Exam  Constitutional: She appears well-developed and well-nourished.  Cardiovascular: Normal rate and regular rhythm.   Respiratory: Effort normal.  She exhibits tenderness.  GI: Soft. There is no tenderness.  Genitourinary: Vagina normal.  Neurological: She is alert.  Psychiatric: She has a normal mood and affect.    Prenatal labs: ABO, Rh: --/--/B NEG (09/29 1315) Antibody: POS (09/29 1315) Rubella:  Non immune RPR:   NR HBsAg:  Neg  HIV:   NR GBS:   unknown Panorama and AFP WNL  Assessment/Plan: Pt is type 1 diabetic with preeclampsia diagnosed by worsening BP and progressive proteinuria.  She has no HA and some right chest pain which seems more abdominal wall--relieved with one percocet.  I have spoken with Dr. Sharl Ma, and he will continue to manage her insulin pump remotely.  When delivery is indicated we will begin the glucomander protocol.  Pt received betamethasone x 2 08/18/15-08/19/15 and no need to repeat currently.   FHR category 1 all day so will change to q shift monitoring BP improved at rest to 130-140/90's for most part, with no meds Will repeat labs in AM I have d/w pt I think she needs to remain in-house until delivery.  Goal will be to get pt to 34 weeks if BP stable and labs stable. Last [name] 2 weeks ago baby vertex and 80%ile.   Oliver Pila 09/04/2015, 8:40 PM

## 2015-09-05 LAB — COMPREHENSIVE METABOLIC PANEL
ALBUMIN: 1.8 g/dL — AB (ref 3.5–5.0)
ALK PHOS: 106 U/L (ref 38–126)
ALT: 12 U/L — AB (ref 14–54)
ANION GAP: 7 (ref 5–15)
AST: 17 U/L (ref 15–41)
BILIRUBIN TOTAL: 0.3 mg/dL (ref 0.3–1.2)
BUN: 13 mg/dL (ref 6–20)
CALCIUM: 8 mg/dL — AB (ref 8.9–10.3)
CO2: 21 mmol/L — AB (ref 22–32)
CREATININE: 0.56 mg/dL (ref 0.44–1.00)
Chloride: 110 mmol/L (ref 101–111)
GFR calc non Af Amer: >60 mL/min (ref >60)
GLUCOSE: 83 mg/dL (ref 65–99)
Potassium: 3.8 mmol/L (ref 3.5–5.1)
SODIUM: 138 mmol/L (ref 135–145)
TOTAL PROTEIN: 5 g/dL — AB (ref 6.5–8.1)

## 2015-09-05 LAB — CBC
HEMATOCRIT: 26.5 % — AB (ref 36.0–46.0)
HEMOGLOBIN: 8.7 g/dL — AB (ref 12.0–15.0)
MCH: 29.3 pg (ref 26.0–34.0)
MCHC: 32.8 g/dL (ref 30.0–36.0)
MCV: 89.2 fL (ref 78.0–100.0)
Platelets: 215 10*3/uL (ref 150–400)
RBC: 2.97 MIL/uL — AB (ref 3.87–5.11)
RDW: 13 % (ref 11.5–15.5)
WBC: 9.5 10*3/uL (ref 4.0–10.5)

## 2015-09-05 LAB — GLUCOSE, CAPILLARY
GLUCOSE-CAPILLARY: 113 mg/dL — AB (ref 65–99)
Glucose-Capillary: 82 mg/dL (ref 65–99)
Glucose-Capillary: 53 mg/dL — ABNORMAL LOW (ref 65–99)
Glucose-Capillary: 82 mg/dL (ref 65–99)

## 2015-09-05 MED ORDER — SODIUM CHLORIDE 0.9 % IJ SOLN: 3.0000 mL | INTRAMUSCULAR | Status: DC | PRN

## 2015-09-05 MED ORDER — SODIUM CHLORIDE 0.9 % IJ SOLN
3.0000 mL | Freq: Two times a day (BID) | INTRAMUSCULAR | Status: DC
  Administered 2015-09-05 – 2015-09-06 (×3): 3 mL via INTRAVENOUS

## 2015-09-05 MED ORDER — FERROUS SULFATE 325 (65 FE) MG PO TABS
325.0000 mg | ORAL_TABLET | Freq: Two times a day (BID) | ORAL | Status: DC
  Administered 2015-09-05 – 2015-09-08 (×7): 325 mg via ORAL
  Filled 2015-09-05 (×7): qty 1

## 2015-09-05 MED ORDER — BUTALBITAL-APAP-CAFFEINE 50-325-40 MG PO TABS
1.0000 | ORAL_TABLET | Freq: Once | ORAL | Status: AC
  Administered 2015-09-05: 1 via ORAL
  Filled 2015-09-05: qty 1

## 2015-09-05 MED ORDER — INFLUENZA VAC SPLIT QUAD 0.5 ML IM SUSY
0.5000 mL | PREFILLED_SYRINGE | INTRAMUSCULAR | Status: AC
  Administered 2015-09-06: 0.5 mL via INTRAMUSCULAR

## 2015-09-05 NOTE — Progress Notes (Addendum)
Patient ID: [name], female   DOB: 1990/12/09, 24 y.o.   MRN: [id number] HD #2  Preeclampsia/ IDDM  32 0/7 Pt had good night, no HA and right chest pain improved BP 130-140/90-100  Without meds, at rest Abdomen gravid, NT  Labs WNL this AM except anemic at 8.7 with hydration, will check daily through weekend then if stay normal reduce frequency--begin po iron NST category 1 Plan next [name] in one week. D/w pt would deliver for significantly abnormal labs, severe range BP that cannot be controlled with meds, or symptomatic Given increased seizure risk, will keep in-house for now

## 2015-09-05 NOTE — Progress Notes (Signed)
Pt feeling a little "bad", hands shaking, CBG 53. Pt given apple juice and a graham cracker.

## 2015-09-05 NOTE — Progress Notes (Signed)
Spoke with  patient over the phone regarding diabetes and home regimen for diabetes management.  Patient states that she has Type 1 diabetes and is followed by Dr. Sharl Ma for diabetes management and her last visit with Dr. Sharl Ma was last week.  Patient states that she received Betamethasone on 08/18/15 and 08/19/15 and glucose was initially elevated but has trended back down and now is better controlled. Patient reports that her glucose has ranged from 65-130's mg/dl over the past week. Inquired about hypoglycemia since glucose was noted to be 53 mg/dl today at 69:62 am. Patient reports that is the first low glucose she has had in a few weeks.  Patient uses a Tandem T-flex insulin pump as an outpatient for diabetes control. Patient has insulin pump connected at this time and is using the insulin pump for inpatient glycemic control.  Inquired about insulin pump settings and patient was able to quickly look up settings on her insulin pump and reports them to be as follows:   Basal insulin  12A 2.5 units/hour Total daily basal insulin: 60 units/24 hours  Carb Coverage 1:2.3 1 unit for every 2.3 grams of carbohydrates  Insulin Sensitivity 1:15 1 unit drops blood glucose 15 mg/dl  Target Glucose Goals 95M 80 mg/dl  Patient reports this is her second pregnancy and she understands how insulin needs change through out pregnancy. Discussed insulin pump use while hospitalized and informed patient that at time of delivery she would likely be asked to remove her insulin pump and an insulin drip would be used for glycemic control. Then following delivery, patient could resume insulin pump once her settings were changed as directed by Dr. Sharl Ma (with post delivery setting recommendations). Encouraged patient to stay in contact with Dr. Sharl Ma for further insulin pump changes as needed. Informed patient that inpatient diabetes coordinator will continue to follow while she is inpatient.  Patient verbalized understanding of  information discussed and states that she does not have any further questions related to diabetes at this time.  Thanks, Orlando Penner, RN, MSN, CCRN, CDE Diabetes Coordinator Inpatient Diabetes Program [phone] (Team Pager from 8am to 5pm) [phone] ([facility] office) [phone] [facility] office) [phone] [facility] office)

## 2015-09-05 NOTE — Progress Notes (Signed)
Initial Nutrition Assessment  DOCUMENTATION CODES:   Not applicable  INTERVENTION:  CHO modified gestational diabetic diet, CHO pattern altered to fit home diet: 50 g at breakfast, 70 g at lunch and dinner, 30 g snacks   NUTRITION DIAGNOSIS:   Increased nutrient needs related to  (pregnancy and fetal growth requirements) as evidenced by  ([redacted] weeks pregnant).  GOAL:   Patient will meet greater than or equal to 90% of their needs  MONITOR:   Labs, Weight trends  REASON FOR ASSESSMENT:   Gestational Diabetes, Antenatal    ASSESSMENT:   32 0/7 weeks with preeclampsia. DM I  since age 24 years. Dr Sharl Ma, endocrinologist. GDM managed with insulin pump. Pre-pregnancy weight 128 lbs, weight gain 39 lbs. Adept at counting CHO's. Typical pattern at home: 50 g at B, 70 g at L & D, 30 g at snacks   Diet Order:  Diet gestational carb mod Room service appropriate?: Yes; Fluid consistency:: Thin  Skin:  Reviewed, no issues  Height:   Ht Readings from Last 1 Encounters:  09/04/15  (1.549 m)    Weight:   Wt Readings from Last 1 Encounters:  09/04/15 167 lb 4 oz (75.864 kg)    Ideal Body Weight:  47.7 kg  BMI:  Body mass index is 31.62 kg/(m^2).  Estimated Nutritional Needs:   Kcal:  1700-1900  Protein:  72-82 g  Fluid:  2  Liters  EDUCATION NEEDS:   No education needs identified at this time  Inez Pilgrim.Odis Luster LDN Neonatal Nutrition Support Specialist/RD III Pager [phone]      Phone [phone]

## 2015-09-05 NOTE — Progress Notes (Signed)
I introduced spiritual care services to [name] who is 32 weeks trying to get to 70.  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  She delivered a baby with Trisomy 78, [name], about 20 mos ago and was able to hold him for an hour before he died.  She said this pregnancy hasn't triggered a lot of grief from that loss, but this development is new.  She's pregnant with a girl, [name].     09/05/15 1506  Clinical Encounter Type  Visited With Patient and family together  Visit Type Initial  Spiritual Encounters  Spiritual Needs Emotional

## 2015-09-06 LAB — CBC
HCT: 28.8 % — ABNORMAL LOW (ref 36.0–46.0)
HEMATOCRIT: 28.8 % — AB (ref 36.0–46.0)
Hemoglobin: 9.3 g/dL — ABNORMAL LOW (ref 12.0–15.0)
Hemoglobin: 9.3 g/dL — ABNORMAL LOW (ref 12.0–15.0)
MCH: 28.9 pg (ref 26.0–34.0)
MCH: 29.2 pg (ref 26.0–34.0)
MCHC: 32.3 g/dL (ref 30.0–36.0)
MCHC: 32.3 g/dL (ref 30.0–36.0)
MCV: 89.4 fL (ref 78.0–100.0)
MCV: 90.3 fL (ref 78.0–100.0)
PLATELETS: 239 10*3/uL (ref 150–400)
PLATELETS: 260 10*3/uL (ref 150–400)
RBC: 3.22 MIL/uL — ABNORMAL LOW (ref 3.87–5.11)
RBC: 3.19 MIL/uL — ABNORMAL LOW (ref 3.87–5.11)
RDW: 13 % (ref 11.5–15.5)
RDW: 13.2 % (ref 11.5–15.5)
WBC: 10.5 10*3/uL (ref 4.0–10.5)
WBC: 12.4 10*3/uL — AB (ref 4.0–10.5)

## 2015-09-06 LAB — COMPREHENSIVE METABOLIC PANEL
ALT: 15 U/L (ref 14–54)
ALT: 14 U/L (ref 14–54)
ANION GAP: 5 (ref 5–15)
AST: 16 U/L (ref 15–41)
AST: 18 U/L (ref 15–41)
Albumin: 1.9 g/dL — ABNORMAL LOW (ref 3.5–5.0)
Albumin: 1.9 g/dL — ABNORMAL LOW (ref 3.5–5.0)
Alkaline Phosphatase: 130 U/L — ABNORMAL HIGH (ref 38–126)
Alkaline Phosphatase: 138 U/L — ABNORMAL HIGH (ref 38–126)
Anion gap: 4 — ABNORMAL LOW (ref 5–15)
BILIRUBIN TOTAL: 0.6 mg/dL (ref 0.3–1.2)
BUN: 11 mg/dL (ref 6–20)
BUN: 15 mg/dL (ref 6–20)
CHLORIDE: 110 mmol/L (ref 101–111)
CHLORIDE: 108 mmol/L (ref 101–111)
CO2: 22 mmol/L (ref 22–32)
CO2: 23 mmol/L (ref 22–32)
Calcium: 8.3 mg/dL — ABNORMAL LOW (ref 8.9–10.3)
Calcium: 8 mg/dL — ABNORMAL LOW (ref 8.9–10.3)
Creatinine, Ser: 0.56 mg/dL (ref 0.44–1.00)
Creatinine, Ser: 0.62 mg/dL (ref 0.44–1.00)
GFR calc Af Amer: >60 mL/min (ref >60)
Glucose, Bld: 48 mg/dL — ABNORMAL LOW (ref 65–99)
Glucose, Bld: 79 mg/dL (ref 65–99)
POTASSIUM: 3.7 mmol/L (ref 3.5–5.1)
POTASSIUM: 3.7 mmol/L (ref 3.5–5.1)
Sodium: 136 mmol/L (ref 135–145)
Sodium: 136 mmol/L (ref 135–145)
TOTAL PROTEIN: 5.2 g/dL — AB (ref 6.5–8.1)
Total Bilirubin: 0.7 mg/dL (ref 0.3–1.2)
Total Protein: 5.8 g/dL — ABNORMAL LOW (ref 6.5–8.1)

## 2015-09-06 LAB — GLUCOSE, CAPILLARY
GLUCOSE-CAPILLARY: 55 mg/dL — AB (ref 65–99)
GLUCOSE-CAPILLARY: 64 mg/dL — AB (ref 65–99)
GLUCOSE-CAPILLARY: 73 mg/dL (ref 65–99)
GLUCOSE-CAPILLARY: 133 mg/dL — AB (ref 65–99)
Glucose-Capillary: 46 mg/dL — ABNORMAL LOW (ref 65–99)
Glucose-Capillary: 67 mg/dL (ref 65–99)
Glucose-Capillary: 43 mg/dL — CL (ref 65–99)
Glucose-Capillary: 53 mg/dL — ABNORMAL LOW (ref 65–99)
Glucose-Capillary: 76 mg/dL (ref 65–99)

## 2015-09-06 MED ORDER — BUTALBITAL-APAP-CAFFEINE 50-325-40 MG PO TABS
1.0000 | ORAL_TABLET | Freq: Once | ORAL | Status: AC
  Administered 2015-09-06: 1 via ORAL
  Filled 2015-09-06: qty 1

## 2015-09-06 NOTE — Progress Notes (Signed)
Hypoglycemic Event  CBG:43  Treatment: 4oz apple juice  Symptoms: nausea, woozy  Follow-up CBG: Time:2104 CBG Result:53  Possible Reasons for Event: not enough carbs eaten for dinner   Comments/MD notified:Bovard 2129 post graham cracker and peanut butter CBG 76    [name] S  Remember to initiate Hypoglycemia Order Set & complete

## 2015-09-06 NOTE — Progress Notes (Signed)
Hypoglycemic Event  CBG: 46  Treatment: 4oz apple juice  Symptoms: nauseated  Follow-up CBG: Time:0615 CBG Result:55  Possible Reasons for Event:inadequate PM snak  Comments/MD notified: Bovard Graham crackers and peanut butter given @ 0615 subsequent CBG @ 0630 was 63   [name]  Remember to initiate Hypoglycemia Order Set & complete

## 2015-09-06 NOTE — Progress Notes (Signed)
Patient ID: [name], female   DOB: July 28, 1991, 24 y.o.   MRN: [id number]  HD#3 PreE/DM 1 s/p BMZ  Doing well.  +FM, no LOF, no VB, occ ctx.  No PIH sx's.  Continued occasional rib pain.  T/C muscle relaxers.  Low sugars this AM, didn't eat pm snack.    AF BP 133-147/89-96 (147/96) good uop Stable labs nl Cr, nl LFTs FSBS well-controlled  gen NAD Abd soft, gravid  FHTs 145-160, good var, category 1 toco occ  24yo G2P0100 with PreE/IgA nephropathy.DM 1 Continue close monitoring Stable labs, Hgb improved

## 2015-09-07 LAB — GLUCOSE, CAPILLARY
Glucose-Capillary: 72 mg/dL (ref 65–99)
Glucose-Capillary: 106 mg/dL — ABNORMAL HIGH (ref 65–99)
Glucose-Capillary: 109 mg/dL — ABNORMAL HIGH (ref 65–99)
Glucose-Capillary: 60 mg/dL — ABNORMAL LOW (ref 65–99)

## 2015-09-07 LAB — COMPREHENSIVE METABOLIC PANEL
ALT: 15 U/L (ref 14–54)
AST: 17 U/L (ref 15–41)
Albumin: 1.9 g/dL — ABNORMAL LOW (ref 3.5–5.0)
Alkaline Phosphatase: 131 U/L — ABNORMAL HIGH (ref 38–126)
Anion gap: 4 — ABNORMAL LOW (ref 5–15)
BILIRUBIN TOTAL: 0.5 mg/dL (ref 0.3–1.2)
BUN: 14 mg/dL (ref 6–20)
CO2: 22 mmol/L (ref 22–32)
Calcium: 8.1 mg/dL — ABNORMAL LOW (ref 8.9–10.3)
Chloride: 109 mmol/L (ref 101–111)
Creatinine, Ser: 0.53 mg/dL (ref 0.44–1.00)
Glucose, Bld: 79 mg/dL (ref 65–99)
POTASSIUM: 3.6 mmol/L (ref 3.5–5.1)
Sodium: 135 mmol/L (ref 135–145)
TOTAL PROTEIN: 5.5 g/dL — AB (ref 6.5–8.1)

## 2015-09-07 LAB — CBC
HEMATOCRIT: 27.9 % — AB (ref 36.0–46.0)
Hemoglobin: 9.1 g/dL — ABNORMAL LOW (ref 12.0–15.0)
MCH: 29.4 pg (ref 26.0–34.0)
MCHC: 32.6 g/dL (ref 30.0–36.0)
MCV: 90 fL (ref 78.0–100.0)
PLATELETS: 231 10*3/uL (ref 150–400)
RBC: 3.1 MIL/uL — ABNORMAL LOW (ref 3.87–5.11)
RDW: 13.2 % (ref 11.5–15.5)
WBC: 10.6 10*3/uL — AB (ref 4.0–10.5)

## 2015-09-07 NOTE — Progress Notes (Signed)
Patient ID: [name], female   DOB: 10-25-1991, 24 y.o.   MRN: [id number]  24yo G2P0100 at 32+ with PreE/IgA nepropathy/DM 1  Doing OK, Low BS overnight.  Some HA yesterday, treated with Fiorcet.  Labs checked overnight and normal, increasing BP and HA, weight gain/+ I/O - continue close monitoring.  Increasing edema  AF BP 138-154/92-104 gen NAD Abd soft, gravid, NT FSBS - well controlled, except lows  FHTs 140-150, good var, category 1 toco irritability, occ ctx  Continue current mgmt, possible need for delivery. Close monitoring.

## 2015-09-07 NOTE — Consult Note (Signed)
[facility]  Prenatal Consult       09/07/2015  11:12 AM   I was asked by Dr. Hayes Ludwig to consult on this patient for possible preterm delivery.  I had the pleasure of meeting with [name], her husband, and her mother and father today.  She is a 24 y/o G2P0100 at 44 and 2/[redacted] weeks gestation.  She was admitted to the antepartum unit on 9/29 for pre-eclampsia.  Her medical history includes Type 1 DM, IGA nephropathy, CF carrier (FOB negative) and HSV (on valtrex suppression).  Her prior pregnancy was prenatally diagnosed with Trisomy 51 and she had an IOL at 29 weeks for worsening fetal status.  The infant received palliative care and died after a few hours of life.  She now has worsening pre-eclampsia so delivery will likely be soon.    I explained that the neonatal intensive care team would be present for the delivery and outlined the likely delivery room course for this baby including routine resuscitation and NRP-guided approaches to the treatment of respiratory distress. We discussed other common problems associated with prematurity including respiratory distress syndrome/CLD, apnea, feeding issues, temperature regulation, and infection risk.     We discussed the average length of stay but I noted that the actual LOS would depend on the severity of problems encountered and response to treatments.  We discussed visitation policies and the resources available while her child is in the hospital.  We discussed the importance of good nutrition and various methods of providing nutrition (parenteral hyperalimentation, gavage feedings and/or oral feeding). We discussed the benefits of human milk. I encouraged breast feeding and pumping soon after birth and outlined resources that are available to support breast feeding.  She intends to provide breast milk and eventually breastfeed.    Thank you for involving [name] in the care of this patient. A member of our team will be  available should the family have additional questions.  Time for consultation approximately 45 minutes.   _____________________ Electronically Signed By: Maryan Char, MD Neonatologist

## 2015-09-08 ENCOUNTER — Inpatient Hospital Stay (HOSPITAL_COMMUNITY): Payer: PRIVATE HEALTH INSURANCE

## 2015-09-08 ENCOUNTER — Inpatient Hospital Stay (HOSPITAL_COMMUNITY): Payer: PRIVATE HEALTH INSURANCE | Admitting: Anesthesiology

## 2015-09-08 DIAGNOSIS — O1490 Unspecified pre-eclampsia, unspecified trimester: Secondary | ICD-10-CM | POA: Diagnosis present

## 2015-09-08 LAB — COMPREHENSIVE METABOLIC PANEL
ALBUMIN: 1.8 g/dL — AB (ref 3.5–5.0)
ALBUMIN: 1.8 g/dL — AB (ref 3.5–5.0)
ALK PHOS: 136 U/L — AB (ref 38–126)
ALT: 15 U/L (ref 14–54)
ALT: 15 U/L (ref 14–54)
AST: 19 U/L (ref 15–41)
AST: 27 U/L (ref 15–41)
Alkaline Phosphatase: 128 U/L — ABNORMAL HIGH (ref 38–126)
Anion gap: 4 — ABNORMAL LOW (ref 5–15)
Anion gap: 11 (ref 5–15)
BUN: 12 mg/dL (ref 6–20)
BUN: 11 mg/dL (ref 6–20)
CALCIUM: 7.6 mg/dL — AB (ref 8.9–10.3)
CHLORIDE: 110 mmol/L (ref 101–111)
CHLORIDE: 103 mmol/L (ref 101–111)
CO2: 22 mmol/L (ref 22–32)
CO2: 19 mmol/L — AB (ref 22–32)
CREATININE: 0.59 mg/dL (ref 0.44–1.00)
CREATININE: 0.52 mg/dL (ref 0.44–1.00)
Calcium: 8 mg/dL — ABNORMAL LOW (ref 8.9–10.3)
GFR calc Af Amer: >60 mL/min (ref >60)
GFR calc non Af Amer: >60 mL/min (ref >60)
GFR calc non Af Amer: >60 mL/min (ref >60)
GLUCOSE: 69 mg/dL (ref 65–99)
GLUCOSE: 154 mg/dL — AB (ref 65–99)
Potassium: 3.8 mmol/L (ref 3.5–5.1)
Potassium: 4.6 mmol/L (ref 3.5–5.1)
SODIUM: 136 mmol/L (ref 135–145)
SODIUM: 133 mmol/L — AB (ref 135–145)
Total Bilirubin: 0.2 mg/dL — ABNORMAL LOW (ref 0.3–1.2)
Total Bilirubin: 0.5 mg/dL (ref 0.3–1.2)
Total Protein: 5.4 g/dL — ABNORMAL LOW (ref 6.5–8.1)
Total Protein: 5 g/dL — ABNORMAL LOW (ref 6.5–8.1)

## 2015-09-08 LAB — TYPE AND SCREEN
ABO/RH(D): B NEG
Antibody Screen: POSITIVE
DAT, IgG: NEGATIVE
UNIT DIVISION: 0
Unit division: 0

## 2015-09-08 LAB — CBC
HCT: 28.5 % — ABNORMAL LOW (ref 36.0–46.0)
HCT: 29.9 % — ABNORMAL LOW (ref 36.0–46.0)
HEMOGLOBIN: 9.3 g/dL — AB (ref 12.0–15.0)
Hemoglobin: 9.8 g/dL — ABNORMAL LOW (ref 12.0–15.0)
MCH: 29.4 pg (ref 26.0–34.0)
MCH: 29.7 pg (ref 26.0–34.0)
MCHC: 32.6 g/dL (ref 30.0–36.0)
MCHC: 32.8 g/dL (ref 30.0–36.0)
MCV: 90.2 fL (ref 78.0–100.0)
MCV: 90.6 fL (ref 78.0–100.0)
PLATELETS: 253 10*3/uL (ref 150–400)
PLATELETS: 277 10*3/uL (ref 150–400)
RBC: 3.16 MIL/uL — AB (ref 3.87–5.11)
RBC: 3.3 MIL/uL — AB (ref 3.87–5.11)
RDW: 13.3 % (ref 11.5–15.5)
RDW: 13.5 % (ref 11.5–15.5)
WBC: 11.1 10*3/uL — AB (ref 4.0–10.5)
WBC: 12.5 10*3/uL — ABNORMAL HIGH (ref 4.0–10.5)

## 2015-09-08 LAB — GLUCOSE, CAPILLARY
GLUCOSE-CAPILLARY: 52 mg/dL — AB (ref 65–99)
GLUCOSE-CAPILLARY: 64 mg/dL — AB (ref 65–99)
GLUCOSE-CAPILLARY: 198 mg/dL — AB (ref 65–99)
GLUCOSE-CAPILLARY: 278 mg/dL — AB (ref 65–99)
Glucose-Capillary: 83 mg/dL (ref 65–99)
Glucose-Capillary: 85 mg/dL (ref 65–99)
Glucose-Capillary: 55 mg/dL — ABNORMAL LOW (ref 65–99)
Glucose-Capillary: 96 mg/dL (ref 65–99)
Glucose-Capillary: 184 mg/dL — ABNORMAL HIGH (ref 65–99)
Glucose-Capillary: 215 mg/dL — ABNORMAL HIGH (ref 65–99)
Glucose-Capillary: 233 mg/dL — ABNORMAL HIGH (ref 65–99)

## 2015-09-08 LAB — GROUP B STREP BY PCR: Group B strep by PCR: NEGATIVE

## 2015-09-08 LAB — OB RESULTS CONSOLE GBS: GBS: NEGATIVE

## 2015-09-08 MED ORDER — OXYTOCIN 40 UNITS IN LACTATED RINGERS INFUSION - SIMPLE MED
1.0000 m[IU]/min | INTRAVENOUS | Status: DC
  Administered 2015-09-08: 2 m[IU]/min via INTRAVENOUS
  Filled 2015-09-08: qty 1000

## 2015-09-08 MED ORDER — BUTALBITAL-APAP-CAFFEINE 50-325-40 MG PO TABS
1.0000 | ORAL_TABLET | Freq: Once | ORAL | Status: AC
  Administered 2015-09-08: 1 via ORAL
  Filled 2015-09-08: qty 1

## 2015-09-08 MED ORDER — PENICILLIN G POTASSIUM 5000000 UNITS IJ SOLR
2.5000 10*6.[IU] | INTRAMUSCULAR | Status: DC
  Filled 2015-09-08 (×3): qty 2.5

## 2015-09-08 MED ORDER — DIPHENHYDRAMINE HCL 50 MG/ML IJ SOLN: 12.5000 mg | INTRAMUSCULAR | Status: DC | PRN

## 2015-09-08 MED ORDER — LIDOCAINE HCL (PF) 1 % IJ SOLN
INTRAMUSCULAR | Status: DC | PRN
  Administered 2015-09-08: 5 mL via EPIDURAL
  Administered 2015-09-08: 2 mL via EPIDURAL
  Administered 2015-09-08: 3 mL via EPIDURAL

## 2015-09-08 MED ORDER — LABETALOL HCL 100 MG PO TABS
100.0000 mg | ORAL_TABLET | Freq: Three times a day (TID) | ORAL | Status: DC
  Administered 2015-09-08 – 2015-09-11 (×10): 100 mg via ORAL
  Filled 2015-09-08 (×13): qty 1

## 2015-09-08 MED ORDER — ONDANSETRON HCL 4 MG/2ML IJ SOLN: 4.0000 mg | Freq: Four times a day (QID) | INTRAMUSCULAR | Status: DC | PRN

## 2015-09-08 MED ORDER — OXYTOCIN BOLUS FROM INFUSION
500.0000 mL | INTRAVENOUS | Status: DC
  Administered 2015-09-09: 500 mL via INTRAVENOUS

## 2015-09-08 MED ORDER — DEXTROSE IN LACTATED RINGERS 5 % IV SOLN
INTRAVENOUS | Status: DC
  Administered 2015-09-08: 14:00:00 via INTRAVENOUS

## 2015-09-08 MED ORDER — TERBUTALINE SULFATE 1 MG/ML IJ SOLN: 0.2500 mg | Freq: Once | INTRAMUSCULAR | Status: DC | PRN

## 2015-09-08 MED ORDER — PHENYLEPHRINE 40 MCG/ML (10ML) SYRINGE FOR IV PUSH (FOR BLOOD PRESSURE SUPPORT): 80.0000 ug | PREFILLED_SYRINGE | INTRAVENOUS | Status: DC | PRN

## 2015-09-08 MED ORDER — ACETAMINOPHEN 325 MG PO TABS: 650.0000 mg | ORAL_TABLET | ORAL | Status: DC | PRN

## 2015-09-08 MED ORDER — FENTANYL 2.5 MCG/ML BUPIVACAINE 1/10 % EPIDURAL INFUSION (WH - ANES)
14.0000 mL/h | INTRAMUSCULAR | Status: DC | PRN
Start: 2015-09-08 — End: 2015-09-09
  Administered 2015-09-08: 12 mL/h via EPIDURAL
  Filled 2015-09-08: qty 125

## 2015-09-08 MED ORDER — OXYTOCIN 40 UNITS IN LACTATED RINGERS INFUSION - SIMPLE MED
62.5000 mL/h | INTRAVENOUS | Status: DC
  Administered 2015-09-09: 62.5 mL/h via INTRAVENOUS
  Filled 2015-09-08: qty 1000

## 2015-09-08 MED ORDER — MAGNESIUM SULFATE 50 % IJ SOLN
2.0000 g/h | INTRAVENOUS | Status: DC
  Administered 2015-09-08 – 2015-09-09 (×2): 2 g/h via INTRAVENOUS
  Filled 2015-09-08 (×2): qty 80

## 2015-09-08 MED ORDER — OXYCODONE-ACETAMINOPHEN 5-325 MG PO TABS
2.0000 | ORAL_TABLET | ORAL | Status: DC | PRN
  Filled 2015-09-08: qty 2

## 2015-09-08 MED ORDER — MAGNESIUM SULFATE BOLUS VIA INFUSION
4.0000 g | Freq: Once | INTRAVENOUS | Status: AC
  Administered 2015-09-08: 4 g via INTRAVENOUS
  Filled 2015-09-08: qty 500

## 2015-09-08 MED ORDER — CITRIC ACID-SODIUM CITRATE 334-500 MG/5ML PO SOLN: 30.0000 mL | ORAL | Status: DC | PRN

## 2015-09-08 MED ORDER — BUTORPHANOL TARTRATE 1 MG/ML IJ SOLN: 1.0000 mg | INTRAMUSCULAR | Status: DC | PRN

## 2015-09-08 MED ORDER — OXYCODONE-ACETAMINOPHEN 5-325 MG PO TABS
1.0000 | ORAL_TABLET | ORAL | Status: DC | PRN
  Filled 2015-09-08: qty 1

## 2015-09-08 MED ORDER — PHENYLEPHRINE 40 MCG/ML (10ML) SYRINGE FOR IV PUSH (FOR BLOOD PRESSURE SUPPORT)
80.0000 ug | PREFILLED_SYRINGE | INTRAVENOUS | Status: DC | PRN
  Filled 2015-09-08: qty 20

## 2015-09-08 MED ORDER — FENTANYL 2.5 MCG/ML BUPIVACAINE 1/10 % EPIDURAL INFUSION (WH - ANES)
14.0000 mL/h | INTRAMUSCULAR | Status: DC | PRN
  Administered 2015-09-08: 14 mL/h via EPIDURAL
  Filled 2015-09-08: qty 125

## 2015-09-08 MED ORDER — LACTATED RINGERS IV SOLN: 500.0000 mL | INTRAVENOUS | Status: DC | PRN

## 2015-09-08 MED ORDER — LACTATED RINGERS IV SOLN: INTRAVENOUS | Status: DC

## 2015-09-08 MED ORDER — SODIUM CHLORIDE 0.9 % IV SOLN
INTRAVENOUS | Status: DC
  Administered 2015-09-08: 5.2 [IU]/h via INTRAVENOUS
  Administered 2015-09-08: 0.3 [IU]/h via INTRAVENOUS
  Administered 2015-09-09: 1.6 [IU]/h via INTRAVENOUS
  Administered 2015-09-10: 4.1 [IU]/h via INTRAVENOUS
  Filled 2015-09-08: qty 2.5

## 2015-09-08 MED ORDER — EPHEDRINE 5 MG/ML INJ: 10.0000 mg | INTRAVENOUS | Status: DC | PRN

## 2015-09-08 MED ORDER — LIDOCAINE HCL (PF) 1 % IJ SOLN: 30.0000 mL | INTRAMUSCULAR | Status: DC | PRN

## 2015-09-08 MED ORDER — PENICILLIN G POTASSIUM 5000000 UNITS IJ SOLR
5.0000 10*6.[IU] | Freq: Once | INTRAVENOUS | Status: AC
  Administered 2015-09-08: 5 10*6.[IU] via INTRAVENOUS
  Filled 2015-09-08: qty 5

## 2015-09-08 NOTE — Progress Notes (Signed)
Patient ID: [name], female   DOB: March 04, 1991, 24 y.o.   MRN: [id number] Pt was moved to L&D to induce labor. Dr. Aldine Contes and Dr. Senaida Ores felt she should be delivered. Rapid GBS was negative. She is on 10 mu/minute of pitocin and the cervix is 2 cm 30% effaced and the vertex is at - 3 station. AROM produced clear fluid. She is receiving magnesium sulfate. The penicillin has been discontinued.

## 2015-09-08 NOTE — Progress Notes (Addendum)
Patient ID: [name], female   DOB: Jul 10, 1991, 24 y.o.   MRN: [id number] Late entry note.  Discussed pt with Dr. Claudean Severance as per his consult note.  After returning to her room from the [name], the pt stated her HA had not resolved  with the Fioricet and that she continued to see "floaters."  Given these continued neurological changes, we decided to proceed with induction and delivery. Reviewed with pt that she would be transitioned to glucomander from her insulin pump and then moved to L&D to be placed on Magnesium sulfate and begin pitocin.  She is on po labetalol TID and that will be continued with IV backup as needed.  RN instructed to collect rapid GBS and pt placed on PCN for now. Pt is agreeable to the plan.

## 2015-09-08 NOTE — Progress Notes (Signed)
PC from MD and orders to transfer to L&D for IOL.

## 2015-09-08 NOTE — Anesthesia Preprocedure Evaluation (Signed)
Anesthesia Evaluation  Patient identified by MRN, date of birth, ID band Patient awake    Reviewed: Allergy & Precautions, NPO status , Patient's Chart, lab work & pertinent test results  History of Anesthesia Complications Negative for: history of anesthetic complications  Airway Mallampati: II  TM Distance: >3 FB Neck ROM: Full    Dental  (+) Teeth Intact, Dental Advisory Given   Pulmonary neg pulmonary ROS,    Pulmonary exam normal breath sounds clear to auscultation       Cardiovascular Exercise Tolerance: Good hypertension (Pre-eclampsia), Normal cardiovascular exam Rhythm:Regular Rate:Normal     Neuro/Psych Seizures - (febrile seizures; no AEDs),   Neuromuscular disease    GI/Hepatic negative GI ROS, Neg liver ROS,   Endo/Other  diabetes, Type 1, Insulin DependentObesity  Renal/GU negative Renal ROS     Musculoskeletal negative musculoskeletal ROS (+)   Abdominal   Peds  Hematology negative hematology ROS (+)   Anesthesia Other Findings Day of surgery medications reviewed with the patient.  Reproductive/Obstetrics                             Anesthesia Physical Anesthesia Plan  ASA: III  Anesthesia Plan: Epidural   Post-op Pain Management:    Induction:   Airway Management Planned:   Additional Equipment:   Intra-op Plan:   Post-operative Plan:   Informed Consent: I have reviewed the patients History and Physical, chart, labs and discussed the procedure including the risks, benefits and alternatives for the proposed anesthesia with the patient or authorized representative who has indicated his/her understanding and acceptance.   Dental advisory given  Plan Discussed with:   Anesthesia Plan Comments: (Patient identified. Risks/Benefits/Options discussed with patient including but not limited to bleeding, infection, nerve damage, paralysis, failed block, incomplete  pain control, headache, blood pressure changes, nausea, vomiting, reactions to medication both or allergic, itching and postpartum back pain. Confirmed with bedside nurse the patient's most recent platelet count. Confirmed with patient that they are not currently taking any anticoagulation, have any bleeding history or any family history of bleeding disorders. Patient expressed understanding and wished to proceed. All questions were answered. )        Anesthesia Quick Evaluation

## 2015-09-08 NOTE — Progress Notes (Deleted)
Inpatient Diabetes Program Recommendations  AACE/ADA: New Consensus Statement on Inpatient Glycemic Control (2015)  Target Ranges:  Prepandial:   less than 140 mg/dL      Peak postprandial:   less than 180 mg/dL (1-2 hours)      Critically ill patients:  140 - 180 mg/dL   Review of Glycemic Control Noted hypoglycemia 2 nights ago using insulin pump. Since that time, glucose has been well controlled. Spoke with patient regarding adjusting her insulin pump basal/bolus settings. Pt states she has lowered her rates and is doing well with control. No further needs identified.  Thank you Lenor Coffin, RN, MSN, CDE  Diabetes Inpatient Program Office: [phone] Pager: [phone] 8:00 am to 5:00 pm

## 2015-09-08 NOTE — Consult Note (Signed)
Maternal Fetal Medicine Consultation  Requesting Provider(s): Paula Compton, MD  Reason for consultation: type 1 diabetes on insulin pump, preeclampsia  HPI: [name] is a 24 yo G2P0100,EDD 10/31/2015 currently at [place] 3d seen for consultation due to preeclampsia.  [name] has a history of type 1 diabetes since age 50 without known end-organ disease.  She is currently on an insulin pump that is being managed by Endocrinology.  Additionally, the patient reports a history of IgA nephropathy that was also diagnosed about age 73.  Since the diagnosis, however, she reports that it has been in remission.  Over the last several weeks, the patient's blood pressures have slowly increased.  A protein/creatinine ratio of 1.4 was noted on 9/12; on 9/29 she had a protein/creatinine ratio of 5.6.  She reports an 11 lb weight gain the two weeks prior to admission and a 12 lb weight gain since admission. Last night, the patient had several severe range blood pressures (164/104, 160/101) and she was started on Labetalol 100 mg TID this morning. She reports "low grade" headaches over the last several weeks and some scotomata - mostly improved with Fioricet but not completely resolved.  The fetus is active. She denies RUQ pain.  With the exception of proteinuria, the patient's preeclampsia labs have been within normal limits.  Her past OB history is remarkable for a prior 29 week delivery with Trisomy 18.  OB History: OB History    Gravida Para Term Preterm AB TAB SAB Ectopic Multiple Living   2 1  1      1       PMH:  Past Medical History  Diagnosis Date  . Type 1 diabetes mellitus in patient age 62-19 years with HbA1C goal below 7.5   . Hypoglycemia associated with diabetes   . Goiter   . Diabetic peripheral neuropathy associated with type 1 diabetes mellitus   . Type 1 diabetes mellitus with diabetic autonomic neuropathy   . Diabetic peripheral angiopathy   . Tachycardia   . Diabetes  mellitus type I   . HSV-1 infection   . Hyperthyroidism   . Febrile seizures     Seizures occurred at about 22/-33 months of age.    PSH:  Past Surgical History  Procedure Laterality Date  . Renal biopsy at age 62    . Indecision and drainage of staphylococcal facial abscess    . Abcess drainage     Meds:  Scheduled Meds: . labetalol  100 mg Oral TID  . magnesium  4 g Intravenous Once  . valACYclovir  1,000 mg Oral Daily   Continuous Infusions: . fentaNYL 2.5 mcg/ml w/bupivacaine 1/10% in NS 164m epidural infusion (1225mtotal)    . insulin (NOVOLIN-R) infusion    . lactated ringers Stopped (09/05/15 0910)  . lactated ringers    . magnesium sulfate    . oxytocin 40 units in LR 1000 mL    . oxytocin 40 units in LR 1000 mL    . oxytocin 40 units in LR 1000 mL     PRN Meds:.acetaminophen, butorphanol, citric acid-sodium citrate, diphenhydrAMINE, ePHEDrine, fentaNYL 2.5 mcg/ml w/bupivacaine 1/10% in NS 10062mpidural infusion (125m63mtal), hydrALAZINE, labetalol, lactated ringers, lidocaine (PF), ondansetron, oxyCODONE-acetaminophen, oxyCODONE-acetaminophen, oxyCODONE-acetaminophen, phenylephrine, terbutaline   Allergies:  Allergies  Allergen Reactions  . Pollen Extract Hives, Shortness Of Breath and Swelling    Extreme problems with seasonal allergies (carries an epi-pen for them)  . Adhesive [Tape] Hives    Pt still uses  adhesives although she gets hives  . Sulfa Antibiotics Hives and Nausea And Vomiting   FH:  Family History  Problem Relation Age of Onset  . Hypertension Father   . Drug abuse Father   . Alcohol abuse Father   . Heart disease Maternal Grandmother   . Hyperlipidemia Maternal Grandmother   . Heart disease Paternal Grandfather   . Alcohol abuse Paternal Grandfather   . Hyperlipidemia Paternal Grandfather   . Hyperlipidemia Maternal Grandfather   . Hyperlipidemia Paternal Grandmother    Soc:  Social History   Social History  . Marital Status:  Married    Spouse Name: N/A  . Number of Children: N/A  . Years of Education: N/A   Occupational History  . Not on file.   Social History Main Topics  . Smoking status: Never Smoker   . Smokeless tobacco: Never Used  . Alcohol Use: No  . Drug Use: No  . Sexual Activity: Yes    Birth Control/ Protection: None   Other Topics Concern  . Not on file   Social History Narrative   Lives with mom, stepdad, sister. Attends [employer].      Review of Systems: no vaginal bleeding or cramping/contractions, no LOF, no nausea/vomiting. All other systems reviewed and are negative.  PE:   Filed Vitals:   09/08/15 1259  BP: 150/102  Pulse: 111  Temp:   Resp:     GEN: well-appearing female ABD: gravid, NT  Ultrasound: Single IUP at 32w 3d Preeclampsia, type I diabetes on insulin pump, hx of IgA nephropathy Limited views of the fetal heart and face obtained due to late gestational age The estimated fetal weight today is > 90th %tile. The AC measures > 97th %tile. Normal amniotic fluid volume  Labs: CBC    Component Value Date/Time   WBC 11.1* 09/08/2015 0523   RBC 3.16* 09/08/2015 0523   HGB 9.3* 09/08/2015 0523   HCT 28.5* 09/08/2015 0523   PLT 253 09/08/2015 0523   MCV 90.2 09/08/2015 0523   MCH 29.4 09/08/2015 0523   MCHC 32.6 09/08/2015 0523   RDW 13.3 09/08/2015 0523   LYMPHSABS 1.2 03/08/2011 2241   MONOABS 0.5 03/08/2011 2241   EOSABS 0.1 03/08/2011 2241   BASOSABS 0.0 03/08/2011 2241   CMP     Component Value Date/Time   NA 136 09/08/2015 0523   K 3.8 09/08/2015 0523   CL 110 09/08/2015 0523   CO2 22 09/08/2015 0523   GLUCOSE 69 09/08/2015 0523   BUN 12 09/08/2015 0523   CREATININE 0.59 09/08/2015 0523   CREATININE 0.68 04/12/2012 1533   CALCIUM 8.0* 09/08/2015 0523   PROT 5.4* 09/08/2015 0523   ALBUMIN 1.8* 09/08/2015 0523   AST 19 09/08/2015 0523   ALT 15 09/08/2015 0523   ALKPHOS 128* 09/08/2015 0523   BILITOT 0.2* 09/08/2015 0523   GFRNONAA >60  09/08/2015 0523   GFRAA >60 09/08/2015 0523    A/P: 1) Single IUP at 32w 3d  2) Type 1 diabetes in insulin pump - management per Endocrinology  3) IgA nephropathy - stable per patient report  2) Preeclampsia with severe features - the dramatic increase in proteinuria is most likely due to preeclampsia as her IgA nephropathy has been in "remission" up to this point.  She meets criteria for preeclampsia with severe features given several severe range blood pressures overnight.  The chronic headaches and scotomata are a little concerning but appear to mostly be resolved with Fioricet.  In general,  would recommend delivery at 34 weeks, but feel that it is unlikely that she will be able to achieve that gestational age.  Recommendations: 1) If the patient continues to have severe range blood pressures (SBP 160 or greater; DBP 110 or greater) despite her current antihypertensive medications, would move toward delivery.  Would also move toward delivery if her headaches or visual changes worsen or if any other severe features are met (Creat > 1.2; Plts < 100K, LFTs > 2x normal).  Given current clinical scenario, would have low threshold for delivery for any clinical change. 2) Would start Magnesium sulfate once a decision is made to move toward delivery.  Recommendations discussed with Dr. Marvel Plan.   Thank you for the opportunity to be a part of the care of [place]. Please contact our office if we can be of further assistance.   I spent approximately 30 minutes with this patient with over 50% of time spent in face-to-face counseling.  Benjaman Lobe, MD Maternal Fetal Medicine

## 2015-09-08 NOTE — Progress Notes (Signed)
Patient ID: [name], female   DOB: January 05, 1991, 24 y.o.   MRN: [id number] HD #5 Preeclampsia/DM/IgA  Nephropathy  Pt having some HA's over the weekend, responded to fioricet and not bad this AM  BP trending up a bit 137-164/80-105 NST's category 1 Admission weight 167.4  Today 177.6 lbs UOP adequate  Labs WNL this AM  Will try low dose labetalol and see if BP respond easily, but d/w pt if continue to trend up in severe range will need delivery [name] today for growth/position.  Last in office over 2 weeks ago. Continue daily labs for now Follow headaches, if progress will need delivery

## 2015-09-08 NOTE — Anesthesia Procedure Notes (Signed)
Epidural Patient location during procedure: OB  Staffing Anesthesiologist: Leronda Lewers EDWARD Performed by: anesthesiologist   Preanesthetic Checklist Completed: patient identified, pre-op evaluation, timeout performed, IV checked, risks and benefits discussed and monitors and equipment checked  Epidural Patient position: sitting Prep: DuraPrep Patient monitoring: blood pressure and continuous pulse ox Approach: midline Location: L3-L4 Injection technique: LOR air  Needle:  Needle type: Tuohy  Needle gauge: 17 G Needle length: 9 cm Needle insertion depth: 4.5 cm Catheter size: 19 Gauge Catheter at skin depth: 9.5 cm Test dose: negative and Other (1% Lidocaine)  Additional Notes Patient identified.  Risk benefits discussed including failed block, incomplete pain control, headache, nerve damage, paralysis, blood pressure changes, nausea, vomiting, reactions to medication both toxic or allergic, and postpartum back pain.  Patient expressed understanding and wished to proceed.  All questions were answered.  Sterile technique used throughout procedure and epidural site dressed with sterile barrier dressing. No paresthesia or other complications noted. The patient did not experience any signs of intravascular injection such as tinnitus or metallic taste in mouth nor signs of intrathecal spread such as rapid motor block. Please see nursing notes for vital signs. Reason for block:procedure for pain   

## 2015-09-09 ENCOUNTER — Encounter (HOSPITAL_COMMUNITY): Payer: Self-pay | Admitting: *Deleted

## 2015-09-09 LAB — GLUCOSE, CAPILLARY
GLUCOSE-CAPILLARY: 130 mg/dL — AB (ref 65–99)
GLUCOSE-CAPILLARY: 132 mg/dL — AB (ref 65–99)
GLUCOSE-CAPILLARY: 134 mg/dL — AB (ref 65–99)
GLUCOSE-CAPILLARY: 139 mg/dL — AB (ref 65–99)
GLUCOSE-CAPILLARY: 161 mg/dL — AB (ref 65–99)
GLUCOSE-CAPILLARY: 172 mg/dL — AB (ref 65–99)
GLUCOSE-CAPILLARY: 193 mg/dL — AB (ref 65–99)
GLUCOSE-CAPILLARY: 196 mg/dL — AB (ref 65–99)
GLUCOSE-CAPILLARY: 220 mg/dL — AB (ref 65–99)
Glucose-Capillary: 275 mg/dL — ABNORMAL HIGH (ref 65–99)
Glucose-Capillary: 152 mg/dL — ABNORMAL HIGH (ref 65–99)
Glucose-Capillary: 123 mg/dL — ABNORMAL HIGH (ref 65–99)
Glucose-Capillary: 133 mg/dL — ABNORMAL HIGH (ref 65–99)
Glucose-Capillary: 139 mg/dL — ABNORMAL HIGH (ref 65–99)
Glucose-Capillary: 213 mg/dL — ABNORMAL HIGH (ref 65–99)
Glucose-Capillary: 161 mg/dL — ABNORMAL HIGH (ref 65–99)
Glucose-Capillary: 144 mg/dL — ABNORMAL HIGH (ref 65–99)
Glucose-Capillary: 135 mg/dL — ABNORMAL HIGH (ref 65–99)
Glucose-Capillary: 222 mg/dL — ABNORMAL HIGH (ref 65–99)
Glucose-Capillary: 216 mg/dL — ABNORMAL HIGH (ref 65–99)
Glucose-Capillary: 168 mg/dL — ABNORMAL HIGH (ref 65–99)
Glucose-Capillary: 225 mg/dL — ABNORMAL HIGH (ref 65–99)
Glucose-Capillary: 247 mg/dL — ABNORMAL HIGH (ref 65–99)

## 2015-09-09 LAB — CBC
HCT: 31.4 % — ABNORMAL LOW (ref 36.0–46.0)
HCT: 32.9 % — ABNORMAL LOW (ref 36.0–46.0)
HEMOGLOBIN: 10.8 g/dL — AB (ref 12.0–15.0)
Hemoglobin: 10.2 g/dL — ABNORMAL LOW (ref 12.0–15.0)
MCH: 29.3 pg (ref 26.0–34.0)
MCH: 29.3 pg (ref 26.0–34.0)
MCHC: 32.5 g/dL (ref 30.0–36.0)
MCHC: 32.8 g/dL (ref 30.0–36.0)
MCV: 90.2 fL (ref 78.0–100.0)
MCV: 89.4 fL (ref 78.0–100.0)
PLATELETS: 279 10*3/uL (ref 150–400)
Platelets: 273 10*3/uL (ref 150–400)
RBC: 3.48 MIL/uL — ABNORMAL LOW (ref 3.87–5.11)
RBC: 3.68 MIL/uL — AB (ref 3.87–5.11)
RDW: 13.6 % (ref 11.5–15.5)
RDW: 13.6 % (ref 11.5–15.5)
WBC: 18.7 10*3/uL — AB (ref 4.0–10.5)
WBC: 27 10*3/uL — AB (ref 4.0–10.5)

## 2015-09-09 LAB — COMPREHENSIVE METABOLIC PANEL
ALBUMIN: 2.1 g/dL — AB (ref 3.5–5.0)
ALT: 20 U/L (ref 14–54)
ANION GAP: 10 (ref 5–15)
AST: 27 U/L (ref 15–41)
Alkaline Phosphatase: 151 U/L — ABNORMAL HIGH (ref 38–126)
BUN: 11 mg/dL (ref 6–20)
CHLORIDE: 102 mmol/L (ref 101–111)
CO2: 20 mmol/L — AB (ref 22–32)
Calcium: 7.5 mg/dL — ABNORMAL LOW (ref 8.9–10.3)
Creatinine, Ser: 0.62 mg/dL (ref 0.44–1.00)
GFR calc Af Amer: >60 mL/min (ref >60)
GFR calc non Af Amer: >60 mL/min (ref >60)
GLUCOSE: 166 mg/dL — AB (ref 65–99)
POTASSIUM: 4.4 mmol/L (ref 3.5–5.1)
SODIUM: 132 mmol/L — AB (ref 135–145)
TOTAL PROTEIN: 6 g/dL — AB (ref 6.5–8.1)
Total Bilirubin: 0.4 mg/dL (ref 0.3–1.2)

## 2015-09-09 LAB — RPR: RPR: NONREACTIVE

## 2015-09-09 LAB — MAGNESIUM: MAGNESIUM: 6.1 mg/dL — AB (ref 1.7–2.4)

## 2015-09-09 MED ORDER — LABETALOL HCL 5 MG/ML IV SOLN: 20.0000 mg | INTRAVENOUS | Status: DC | PRN

## 2015-09-09 MED ORDER — HYDRALAZINE HCL 20 MG/ML IJ SOLN: 10.0000 mg | Freq: Once | INTRAMUSCULAR | Status: DC | PRN

## 2015-09-09 MED ORDER — WITCH HAZEL-GLYCERIN EX PADS: 1.0000 "application " | MEDICATED_PAD | CUTANEOUS | Status: DC | PRN

## 2015-09-09 MED ORDER — SENNOSIDES-DOCUSATE SODIUM 8.6-50 MG PO TABS
2.0000 | ORAL_TABLET | ORAL | Status: DC
  Administered 2015-09-10: 2 via ORAL
  Filled 2015-09-09: qty 2

## 2015-09-09 MED ORDER — DIBUCAINE 1 % RE OINT: 1.0000 "application " | TOPICAL_OINTMENT | RECTAL | Status: DC | PRN

## 2015-09-09 MED ORDER — TETANUS-DIPHTH-ACELL PERTUSSIS 5-2.5-18.5 LF-MCG/0.5 IM SUSP
0.5000 mL | Freq: Once | INTRAMUSCULAR | Status: DC
  Filled 2015-09-09: qty 0.5

## 2015-09-09 MED ORDER — PRENATAL MULTIVITAMIN CH
1.0000 | ORAL_TABLET | Freq: Every day | ORAL | Status: DC
  Administered 2015-09-09 – 2015-09-10 (×2): 1 via ORAL
  Filled 2015-09-09 (×3): qty 1

## 2015-09-09 MED ORDER — LACTATED RINGERS IV SOLN
2.0000 g/h | INTRAVENOUS | Status: DC
  Administered 2015-09-10: 2 g/h via INTRAVENOUS
  Filled 2015-09-09: qty 80

## 2015-09-09 MED ORDER — ONDANSETRON HCL 4 MG PO TABS: 4.0000 mg | ORAL_TABLET | ORAL | Status: DC | PRN

## 2015-09-09 MED ORDER — BENZOCAINE-MENTHOL 20-0.5 % EX AERO
1.0000 "application " | INHALATION_SPRAY | CUTANEOUS | Status: DC | PRN
  Administered 2015-09-09: 1 via TOPICAL
  Filled 2015-09-09: qty 56

## 2015-09-09 MED ORDER — OXYCODONE-ACETAMINOPHEN 5-325 MG PO TABS
2.0000 | ORAL_TABLET | ORAL | Status: DC | PRN
  Administered 2015-09-09: 2 via ORAL

## 2015-09-09 MED ORDER — SODIUM BICARBONATE 8.4 % IV SOLN
INTRAVENOUS | Status: DC | PRN
  Administered 2015-09-09: 5 mL via EPIDURAL

## 2015-09-09 MED ORDER — ACETAMINOPHEN 325 MG PO TABS
650.0000 mg | ORAL_TABLET | ORAL | Status: DC | PRN
  Administered 2015-09-09: 650 mg via ORAL
  Filled 2015-09-09: qty 2

## 2015-09-09 MED ORDER — IBUPROFEN 600 MG PO TABS
600.0000 mg | ORAL_TABLET | Freq: Four times a day (QID) | ORAL | Status: DC
  Administered 2015-09-09 – 2015-09-11 (×9): 600 mg via ORAL
  Filled 2015-09-09 (×9): qty 1

## 2015-09-09 MED ORDER — GLUCOSE BLOOD VI STRP: 1.0000 | ORAL_STRIP | Status: DC | PRN

## 2015-09-09 MED ORDER — ZOLPIDEM TARTRATE 5 MG PO TABS: 5.0000 mg | ORAL_TABLET | Freq: Every evening | ORAL | Status: DC | PRN

## 2015-09-09 MED ORDER — DIPHENHYDRAMINE HCL 25 MG PO CAPS: 25.0000 mg | ORAL_CAPSULE | Freq: Four times a day (QID) | ORAL | Status: DC | PRN

## 2015-09-09 MED ORDER — LANOLIN HYDROUS EX OINT: TOPICAL_OINTMENT | CUTANEOUS | Status: DC | PRN

## 2015-09-09 MED ORDER — ONDANSETRON HCL 4 MG/2ML IJ SOLN: 4.0000 mg | INTRAMUSCULAR | Status: DC | PRN

## 2015-09-09 MED ORDER — OXYCODONE-ACETAMINOPHEN 5-325 MG PO TABS
1.0000 | ORAL_TABLET | ORAL | Status: DC | PRN
  Administered 2015-09-09: 1 via ORAL
  Filled 2015-09-09: qty 1

## 2015-09-09 MED ORDER — CEFAZOLIN SODIUM-DEXTROSE 2-3 GM-% IV SOLR
2.0000 g | Freq: Once | INTRAVENOUS | Status: AC
  Administered 2015-09-09: 2 g via INTRAVENOUS
  Filled 2015-09-09: qty 50

## 2015-09-09 MED ORDER — SIMETHICONE 80 MG PO CHEW: 80.0000 mg | CHEWABLE_TABLET | ORAL | Status: DC | PRN

## 2015-09-09 NOTE — Progress Notes (Addendum)
Inpatient Diabetes Program Recommendations  AACE/ADA: New Consensus Statement on Inpatient Glycemic Control (2015)  Target Ranges:  Prepandial:   less than 140 mg/dL      Peak postprandial:   less than 180 mg/dL (1-2 hours)      Critically ill patients:  140 - 180 mg/dL   Review of Glycemic Control  Patient remains on IV insulin drip per GS using the glucostabilizer order set for non-pregnant and non DKA. Patient has been on insulin pump managed by Dr Sharl Ma. Pt must have basal/bolus adjustments (typicallly 1/2 the basal/bolus settings used during pregnancy) prior to discontinuation of the IV insulin per phone call with Dr Sharl Ma. (unless she has already been given the instructions prior to hospitalization.) Pt must have her own pump supplies and can use hospital novolog insulin. Please have patient restart her pump 1-2 hrs prior to discontinuation of the IV insulin.  Thank you Lenor Coffin, RN, MSN, CDE  Diabetes Inpatient Program Office: [phone] Pager: [phone] 8:00 am to 5:00 pm  Ad. Noted that during the time the patient was laboring and during delivery, the GlucoStabilizer glucose target range was change from 90-120 mg/dL to 657-846 mg/dL then to 962-952 mg/dL. Thus glucose increased beyond target range shortly thereafter. If GS instructs "0 units/hr" when the next cbg is due, the GS program will adjust when needed. Now that patient is delivered her order set using the IV insulin is now in program with correct target goals. Our dept is glad to assist with any further concerns   Thank you Lenor Coffin, RN, MSN, CDE  Diabetes Inpatient Program

## 2015-09-09 NOTE — Anesthesia Postprocedure Evaluation (Signed)
Anesthesia Post Note  Patient: [name]  Procedure(s) Performed: * No procedures listed *  Anesthesia type: Epidural  Patient location: Mother/Baby  Post pain: Pain level controlled  Post assessment: Post-op Vital signs reviewed  Last Vitals:  Filed Vitals:   09/09/15 1300  BP: 151/87  Pulse: 117  Temp:   Resp: 18    Post vital signs: Reviewed  Level of consciousness:alert  Complications: No apparent anesthesia complications

## 2015-09-09 NOTE — Progress Notes (Signed)
Patient ID: [name], female   DOB: [birth year], 24 y.o.   MRN: [id number] Blood sugar rose but has now returned to < 200. Pitocin is at 20 mu/ minute and the contractions are not tracing well . The cervix is 4 cm 80% effaced and the vertex is at - 2 station. The FHR tracing is category 1 .

## 2015-09-09 NOTE — Progress Notes (Signed)
Patient ID: [name], female   DOB: 10/21/1991, 24 y.o.   MRN: [id number] Contractions somewhat irregular FHR category 1 Cervix is 9 cm 100% effaced and the vertex is at + 1 station.

## 2015-09-09 NOTE — Progress Notes (Signed)
Glucostabilizer ordered to change rate to 0.0.  Dr. Mindi Slicker notified.  Asked for patient to provide phone number for patient's endocrinologist.  Patient gave number for Dr. Daune Perch office [phone].

## 2015-09-09 NOTE — Progress Notes (Signed)
Patient ID: [name], female   DOB: Jul 22, 1991, 24 y.o.   MRN: [id number] Pitocin is at 26 mu/minute and she is comfortable. Contractions are slightly irregular Cervix is 8 cm with a puffy anterior lip and the vertex is at - 1 station. FHR category 1

## 2015-09-09 NOTE — Lactation Note (Signed)
This note was copied from the chart of [name]. Lactation Consultation Note  Patient Name: [name] [id number] Date: 09/09/2015 Reason for consult: Initial assessment;NICU baby NICU baby 7 hours old, [redacted]w[redacted]d GA. Mom has brought personal pump to the hospital. Discussed reasons for using hospital-grade pump, but mom prefers to use her own. Mom aware of pumping rooms in NICU. Assisted mom to start pumping--mom states that she was too groggy to pump earlier. Assisted mom to hand express, no colostrum present. Mom states she had lots of colostrum during pregnancy. Enc mom to use pump ever 2-3 hours for 15 minutes, skill one pumping session for hours of sleep, and getting in 8 times/24 hours. Enc mom to hand express after pumping and take to NICU for baby as able. Reviewed NICU booklet and LC brochure. Mom aware of OP/BFSG and LC phone line assistance after D/C. Enc mom to offer STS/Kangaroo care and nuzzling/latching at breast as she and baby able.   Maternal Data Has patient been taught Hand Expression?: Yes Does the patient have breastfeeding experience prior to this delivery?: No (Demise of first child at a few hours of life--mom states milk came in and she had mastitis.)  Feeding    LATCH Score/Interventions                      Lactation Tools Discussed/Used Pump Review: Setup, frequency, and cleaning;Milk Storage Initiated by:: JW Date initiated:: 09/09/15   Consult Status Consult Status: Follow-up Date: 09/10/15 Follow-up type: In-patient    [name] 09/09/2015, 4:49 PM

## 2015-09-09 NOTE — Progress Notes (Signed)
Patient ID: [name], female   DOB: January 31, 1991, 24 y.o.   MRN: [id number] The pt has made slow progress at hourly intervals until the last hour when the cervix remained 6 cm Per the RN the station was still - 1 but the pt is having bloody show and she is exp;eriencing pain in her hip The FHR tracing is category 1

## 2015-09-10 LAB — COMPREHENSIVE METABOLIC PANEL
ALBUMIN: 1.7 g/dL — AB (ref 3.5–5.0)
ALK PHOS: 122 U/L (ref 38–126)
ALT: 20 U/L (ref 14–54)
ANION GAP: 6 (ref 5–15)
AST: 26 U/L (ref 15–41)
BILIRUBIN TOTAL: 0.4 mg/dL (ref 0.3–1.2)
BUN: 10 mg/dL (ref 6–20)
CALCIUM: 6.7 mg/dL — AB (ref 8.9–10.3)
CO2: 25 mmol/L (ref 22–32)
Chloride: 103 mmol/L (ref 101–111)
Creatinine, Ser: 0.69 mg/dL (ref 0.44–1.00)
GFR calc Af Amer: >60 mL/min (ref >60)
GLUCOSE: 214 mg/dL — AB (ref 65–99)
POTASSIUM: 4.5 mmol/L (ref 3.5–5.1)
Sodium: 134 mmol/L — ABNORMAL LOW (ref 135–145)
TOTAL PROTEIN: 5.2 g/dL — AB (ref 6.5–8.1)

## 2015-09-10 LAB — CBC
HCT: 30.4 % — ABNORMAL LOW (ref 36.0–46.0)
HEMOGLOBIN: 9.9 g/dL — AB (ref 12.0–15.0)
MCH: 29.2 pg (ref 26.0–34.0)
MCHC: 32.6 g/dL (ref 30.0–36.0)
MCV: 89.7 fL (ref 78.0–100.0)
Platelets: 290 10*3/uL (ref 150–400)
RBC: 3.39 MIL/uL — ABNORMAL LOW (ref 3.87–5.11)
RDW: 13.9 % (ref 11.5–15.5)
WBC: 17.8 10*3/uL — AB (ref 4.0–10.5)

## 2015-09-10 LAB — GLUCOSE, CAPILLARY
GLUCOSE-CAPILLARY: 171 mg/dL — AB (ref 65–99)
GLUCOSE-CAPILLARY: 124 mg/dL — AB (ref 65–99)
GLUCOSE-CAPILLARY: 112 mg/dL — AB (ref 65–99)
GLUCOSE-CAPILLARY: 193 mg/dL — AB (ref 65–99)
GLUCOSE-CAPILLARY: 162 mg/dL — AB (ref 65–99)
GLUCOSE-CAPILLARY: 121 mg/dL — AB (ref 65–99)
GLUCOSE-CAPILLARY: 108 mg/dL — AB (ref 65–99)
GLUCOSE-CAPILLARY: 168 mg/dL — AB (ref 65–99)
Glucose-Capillary: 131 mg/dL — ABNORMAL HIGH (ref 65–99)
Glucose-Capillary: 136 mg/dL — ABNORMAL HIGH (ref 65–99)
Glucose-Capillary: 197 mg/dL — ABNORMAL HIGH (ref 65–99)
Glucose-Capillary: 214 mg/dL — ABNORMAL HIGH (ref 65–99)
Glucose-Capillary: 216 mg/dL — ABNORMAL HIGH (ref 65–99)
Glucose-Capillary: 229 mg/dL — ABNORMAL HIGH (ref 65–99)

## 2015-09-10 MED ORDER — INSULIN PUMP
Freq: Three times a day (TID) | SUBCUTANEOUS | Status: DC
  Administered 2015-09-10: 10.5 via SUBCUTANEOUS
  Administered 2015-09-10 – 2015-09-11 (×3): via SUBCUTANEOUS
  Filled 2015-09-10: qty 1

## 2015-09-10 MED ORDER — RHO D IMMUNE GLOBULIN 1500 UNIT/2ML IJ SOSY
300.0000 ug | PREFILLED_SYRINGE | Freq: Once | INTRAMUSCULAR | Status: AC
  Administered 2015-09-10: 300 ug via INTRAVENOUS
  Filled 2015-09-10: qty 2

## 2015-09-10 NOTE — Progress Notes (Addendum)
Inpatient Diabetes Program Recommendations  AACE/ADA: New Consensus Statement on Inpatient Glycemic Control (2015)  Target Ranges:  Prepandial:   less than 140 mg/dL      Peak postprandial:   less than 180 mg/dL (1-2 hours)      Critically ill patients:  140 - 180 mg/dL   Review of Glycemic Control RN has stated patient has been on insulin pump for 1 hr and has received correction an hour ago. Pt is now ready to eat and correct. Due to half-life of IV insulin, recommended pt be able to use her pump at this time rather than using the IV insulin per GS to correct and cover carbs, as the program will not allow another cbg/correction dose until the following hour. To bolus again for correction and then cover cho's per IV insulin, this would prolong the IV insulin drip need for another few hoursPt needs the insulin coverage now for correction and meal coverage. Minimal time to be using pump prior to discontinuation of IV insulin. Jennifer Charon, RN to allow patient to use her insulin pump to correct and bolus for meal and discontinue the IV insulin. Please feel free to call if I can assist with any concerns or questions.  Thank you Jennifer Coffin, RN, MSN, CDE  Diabetes Inpatient Program Office: [phone] Pager: [phone] 8:00 am to 5:00 pm

## 2015-09-10 NOTE — Discharge Summary (Signed)
Obstetric Discharge Summary Reason for Admission: preeclampsia Prenatal Procedures: NST, Preeclampsia and ultrasound Intrapartum Procedures: spontaneous vaginal delivery with severe shoulder dystocia Postpartum Procedures: magnesium Complications-Operative and Postpartum: fourth degree perineal laceration                                                                             Neonatal death HEMOGLOBIN  Date Value Ref Range Status  09/10/2015 9.9* 12.0 - 15.0 g/dL Final   HCT  Date Value Ref Range Status  09/10/2015 30.4* 36.0 - 46.0 % Final    Physical Exam:  General: alert and cooperative Lochia: appropriate Uterine Fundus: firm   Discharge Diagnoses: Preelampsia                                         Preterm pregnancy induced at 32+ weeks                                        NSVD with fourth degree laceration                                        Type 1 DM                                        Neonatal death              Discharge Information: Date: 09/11/2015 Activity: pelvic rest Diet: diabetic Medications: Ibuprofen, Percocet and labetalol Condition: improved Instructions: refer to practice specific booklet Discharge to: home   Pt was admitted 09/04/15 with elevated BP and proteinuria.  Her labs were WNL otherwise and fetal status reassuring.  She was asymptomatic, but did have significant swelling. She was observed closely in-house.  She had received betamethasone 08/18/15 and 08/19/15 so did not need that repeated.  On 09/08/15 she developed a worsening HA and neurologic sx including some visual changes and BP increased to 160's/105 range.  She was placed on po labetalol and labetalol-- MFM recommended proceeding with delivery. She was transferred to L&D and induction process begun with IV Pitocin.  She was transitioned to the glucommander from her insulin pump for the delivery process.  She made slow but steady progress and reached complete dilation early in the  morning of 09/09/15.  She pushed a little over an hour and had a vaginal delivery complicated by a severe shoulder dystocia.  The baby was delivered with NICU in attendance and required some initial resuscitation but responded well.  The patient was continued on magnesium and did well with good diuresis and stable BP on po labetalol.  The magnesium was discontinued after 24 hours and she was transitioned back to her insulin pump.  The baby did well for the first day of life and required only HFNC with weaning 02.  Day of life 2 late in the  evening the baby began to require increasing 02 support and was intubated.  The baby then gradually decompensated over several hours and despite multiple efforts to correct the hypoxia with interventions, the baby expired.  The patient was given emotional support and allowed to spend time with the baby in her postpartum room.  She remained medically stable and was discharged to home on her labetalol and insulin pump.  She will f/u in office in 4 days for BP check.  She did elect an autopsy.  She was able to spend time with the baby and take photos prior to baby being taken for autopsy.      Follow-up Information    Follow up with [facility]. Schedule an appointment as soon as possible for a visit on 09/15/2015.   Why:  blood pressure check and appointment   Contact information:   [address]  [address] [place] [address] [phone]       Newborn Data: Live born female  Birth Weight: 6 lb 11.9 oz (3059 g) APGAR: 3, 5  Baby expired on DOL 2  [name] 09/11/2015, 5:54 AM

## 2015-09-10 NOTE — Progress Notes (Signed)
I spoke briefly with pt's mother (pt was pumping) who remembers me from when her grandson, [name], was born.  I worked with the family in [facility] prior to delivery and his death.  They are overjoyed for a different outcome this time.  I will follow up with family over the next several days, but please page if needs arise sooner.  [address] Dana Pager, [phone] 3:53 PM    09/10/15 1500  Clinical Encounter Type  Visited With Family  Visit Type Spiritual support

## 2015-09-10 NOTE — Progress Notes (Signed)
Patient ID: [name], female   DOB: Jul 10, 1991, 24 y.o.   MRN: [id number] Pt feeling much better off magnesium.  Pumping breasts and getting colostrum  BP stable off magnesium highest reading 140's/90 Pt ambulating and voiding without difficulty  BS stabilizing now that back on insulin pump  Pt improving from preeclampsia, will monitor BP overnight and Consider d/c tomorrow if stable Baby doing fine in NICU

## 2015-09-10 NOTE — Progress Notes (Signed)
Post Partum Day 1 Subjective: no complaints, tolerating PO and voiding well via foley. Feels effect of MgSO4. In good spirits. Baby doing well  Objective: Blood pressure 130/87, pulse 95, temperature 97.3 F (36.3 C), temperature source Axillary, resp. rate 18, height  (1.549 m), weight 80.559 kg (177 lb 9.6 oz), last menstrual period 01/23/2015, SpO2 99 %, unknown if currently breastfeeding.  Physical Exam:  General: alert, cooperative and no distress Lochia: appropriate Uterine Fundus: firm DVT Evaluation: Negative Homan's sign.   Recent Labs  09/09/15 1050 09/10/15 0638  HGB 10.8* 9.9*  HCT 32.9* 30.4*    Assessment/Plan: PPD#2 Blood glucose overnight was <140 so id not receive any doses of insulin per parameters being 140-180.  Spoke with Dr Sharl Ma, along with pt today and plan for glucose management established as follows:   Will change target of BS from 80 to 100 ( prefers having her run slightly hyperglycemic) Will continue IV insulin for next 2-4hours then stop Basal rate to be 1.25u Pt to start her pump now to overlap pump - will have to give 1u per 5gm carb and 1u per 30dl above/below target of 161  Will stop MgSO4 now as well as has been 24hrs postpartum Will remove foley catheter once magnesium stopped   LOS: 6 days   Sharol Given [name] 09/10/2015, 9:04 AM

## 2015-09-10 NOTE — Lactation Note (Signed)
This note was copied from the chart of Jennifer Magdalena Skilton. Lactation Consultation Note  Patient Name: [name] [id number] Date: 09/10/2015 Reason for consult: Follow-up assessment NICU baby 48 hours old. Mom reports that pumping going well and her milk supply is continuing to increase. Parents states that they are continuing to take EBM to NICU. Enc mom to offer STS/Kangaroo care and nuzzling/latching at breast as she and baby able. Mom aware of LC assistance as well.   Maternal Data    Feeding    LATCH Score/Interventions                      Lactation Tools Discussed/Used     Consult Status Consult Status: Follow-up Date: 09/11/15 Follow-up type: In-patient    [name] 09/10/2015, 9:30 AM

## 2015-09-11 ENCOUNTER — Ambulatory Visit: Payer: Self-pay

## 2015-09-11 LAB — GLUCOSE, CAPILLARY
GLUCOSE-CAPILLARY: 220 mg/dL — AB (ref 65–99)
GLUCOSE-CAPILLARY: 201 mg/dL — AB (ref 65–99)
Glucose-Capillary: 107 mg/dL — ABNORMAL HIGH (ref 65–99)

## 2015-09-11 LAB — RH IG WORKUP (INCLUDES ABO/RH)
ABO/RH(D): B NEG
Fetal Screen: NEGATIVE
GESTATIONAL AGE(WKS): 32.4
Unit division: 0

## 2015-09-11 MED ORDER — OXYCODONE-ACETAMINOPHEN 5-325 MG PO TABS: 1.0000 | ORAL_TABLET | ORAL | Status: AC | PRN

## 2015-09-11 MED ORDER — IBUPROFEN 600 MG PO TABS
600.0000 mg | ORAL_TABLET | Freq: Four times a day (QID) | ORAL | Status: AC
Start: 2015-09-11 — End: ?

## 2015-09-11 MED ORDER — LABETALOL HCL 100 MG PO TABS: 100.0000 mg | ORAL_TABLET | Freq: Three times a day (TID) | ORAL | Status: AC

## 2015-09-11 NOTE — Progress Notes (Signed)
CSW met with parents in MOB's third floor room/318 to offer support and condolences at the passing of their daughter.  Parents present as calm and solemn, welcoming CSW into the room.  MOB states feeling like she may still be in shock.  CSW validated this feeling.  CSW asked if they feel like they have gotten their initial questions answered and asked that they call CSW if they would like to speak with a Neonatologist at any time.  CSW informed them that CSW will contact them once Dr. Wimmer/Neonatologist has the opportunity to review the autopsy report to arrange a face to face meeting.  Parents stated appreciation and understand that the autopsy results take approximately 6 weeks to return.   CSW spoke to parents about coping with a loss and asked about their support system.  CSW stated awareness that they have been through losing a child in the past.  MOB reports that she went to [place] groups for a short period of time and felt it was helpful.  She states she stopped going because she felt that her family support was all that she needed.  CSW provided [employer] brochure and suggested that they consider entering a support group between 3-5 months from now if they feel the need for additional support.  CSW also provided contact information for Hospice and recommends grief counseling as soon as they feel they are ready.   Parents state they will be using [facility] for cremation services.  MOB reports that this is the funeral home who took care of them after her baby passed last year.  Parents do not identify need for further CSW intervention at this time, but know how to contact CSW.

## 2015-09-11 NOTE — Progress Notes (Signed)
Patient ID: [name], female   DOB: [birth year], 25 y.o.   MRN: [id number] Pt desires discharge to home today. Still coping well and family present for support. They confirm she will continue to have support at home  BP stable-will continue on labetalol at home ( 100 mg po tid) and return to office 09/15/15 for bp check. Precautions reviewed Pt will continue to monitor BS which also improved after events of this am

## 2015-09-11 NOTE — Lactation Note (Signed)
This note was copied from the chart of Jennifer Russia Scheiderer. Lactation Consultation Note  RN suggested it would be good for LC to speak w/ Mother about engorgement and milk supply. Upon entering room family stated it would not be a good time. Family member came out out of room and stated MD had discussed engorgement care and cabbage leaf use with mother. Left donation milk brochure with family and suggest they call if LC could be of assistance.  Patient Name: [name] RUEAV'W Date: 09/11/2015     Maternal Data    Feeding    LATCH Score/Interventions                      Lactation Tools Discussed/Used     Consult Status      Dahlia Byes Boschen 09/11/2015, 3:05 PM

## 2015-09-11 NOTE — Progress Notes (Signed)
Patient discharged home with family... Discharge instructions reviewed with patient and she verbalized understanding... Condition stable... No equipment... Ambulated to car with Dianne Dun, RN.

## 2015-09-11 NOTE — Progress Notes (Signed)
Patient very upset, crying and spouse upset due to condition of their baby in NICU.

## 2015-09-11 NOTE — Progress Notes (Signed)
CSW paged Chaplain to ensure their knowledge of the baby's passing.  CSW spoke with Magda Paganini, who is covering [facility] today, who states she will come see the family.

## 2015-09-11 NOTE — Progress Notes (Signed)
Chaplain responded to request by CSW Jill Side to provide spiritual care for the mother and father who experienced the death of their baby.  Both parents were present, as well as the grandparents at this visit. Chaplain offered the ministry of presence, and  encouragement for the the parents and grandparents. Chaplain offered a prayer of healing and peace for all present, and extended the opportunity to continue to utilize spiritual care for support as needed. Chaplain Janell Quiet [phone]

## 2015-09-11 NOTE — Progress Notes (Signed)
Patient ID: [name], female   DOB: 25-Dec-1990, 24 y.o.   MRN: [id number] Came in to see pt when notified her baby had respiratory failure and died about 230am  Pt in room holding baby, tearful but calm  BP have been stable overnight on po labetalol and UOP great BS currently a bit elevated with events of morning, pt managing insulin pump--around 200  afeb vss Fundus NT  Pt surrounded by family and husband by side Emotional support given We discussed autopsy and she is leaning towards this given it is unclear why the baby decompensated so quickly Her preeclampsia seems to be resolving, will follow BS closely as she will likely have decreased po intake

## 2015-09-12 LAB — TYPE AND SCREEN
ABO/RH(D): B NEG
Antibody Screen: POSITIVE
DAT, IGG: NEGATIVE
UNIT DIVISION: 0
UNIT DIVISION: 0
UNIT DIVISION: 0
Unit division: 0

## 2016-06-15 ENCOUNTER — Encounter: Payer: Medicaid Other | Attending: Internal Medicine | Admitting: *Deleted

## 2016-06-15 DIAGNOSIS — O24912 Unspecified diabetes mellitus in pregnancy, second trimester: Secondary | ICD-10-CM

## 2016-06-15 DIAGNOSIS — E1065 Type 1 diabetes mellitus with hyperglycemia: Secondary | ICD-10-CM | POA: Diagnosis not present

## 2016-06-15 DIAGNOSIS — Z713 Dietary counseling and surveillance: Secondary | ICD-10-CM | POA: Diagnosis not present

## 2016-06-16 ENCOUNTER — Encounter: Payer: Self-pay | Admitting: *Deleted

## 2016-06-18 NOTE — Progress Notes (Signed)
CGM Evaluation:  Appt start time: 1530 end time:1600.  Assessment:  Primary concerns today: Patient here for education about moving towards Continuous Glucose Monitoring. She is currently pregnant with EDD of 12/12/2016, but states she will have a scheduled C-Section before then. [facility] and feels confident with her care there. She is currently on the T-Flex insulin pump as her TDD of insulin when she ordered the pump was much higher than she is requiring now. She states she is having hypoglycemia without symptoms for the first time in her life, with EMT's being called by her husband in the middle of the night. Her insurance during pregnancy is Medicaid, she will move to private insurance once the baby is born.   Medications: see list. She uses Humalog in her insulin pump     Intervention:  She states she is aware of the benefits of CGM including: Understanding Glucose Sensing Programming Sensor Information  High Glucose:   Low Glucose   Snooze Alerts Use of Product  Entering BG, Calibration Technique and Graphs with Arrows  She would like to contact the Dexcom Rep, Buena IrishKacey Wood to see what her options are for obtaining CGM during her pregnancy especially with her hypoglycemia unawareness.   Follow Up Patient to contact Dexcom directly, contact information provided.

## 2017-05-18 DIAGNOSIS — R7889 Finding of other specified substances, not normally found in blood: Secondary | ICD-10-CM | POA: Diagnosis not present

## 2017-05-18 DIAGNOSIS — R739 Hyperglycemia, unspecified: Secondary | ICD-10-CM | POA: Diagnosis not present

## 2017-05-18 DIAGNOSIS — R000 Tachycardia, unspecified: Secondary | ICD-10-CM | POA: Diagnosis not present

## 2017-05-18 DIAGNOSIS — E1029 Type 1 diabetes mellitus with other diabetic kidney complication: Secondary | ICD-10-CM | POA: Diagnosis not present

## 2017-05-18 DIAGNOSIS — Z9641 Presence of insulin pump (external) (internal): Secondary | ICD-10-CM | POA: Diagnosis not present

## 2017-05-18 DIAGNOSIS — E1065 Type 1 diabetes mellitus with hyperglycemia: Secondary | ICD-10-CM | POA: Diagnosis not present

## 2017-06-07 DIAGNOSIS — E109 Type 1 diabetes mellitus without complications: Secondary | ICD-10-CM | POA: Diagnosis not present

## 2017-06-07 LAB — IFOBT (OCCULT BLOOD)

## 2017-06-10 DIAGNOSIS — Z6822 Body mass index (BMI) 22.0-22.9, adult: Secondary | ICD-10-CM | POA: Diagnosis not present

## 2017-06-10 DIAGNOSIS — N390 Urinary tract infection, site not specified: Secondary | ICD-10-CM | POA: Diagnosis not present

## 2017-06-21 DIAGNOSIS — E1022 Type 1 diabetes mellitus with diabetic chronic kidney disease: Secondary | ICD-10-CM | POA: Diagnosis not present

## 2017-06-21 DIAGNOSIS — Z794 Long term (current) use of insulin: Secondary | ICD-10-CM | POA: Diagnosis not present

## 2017-06-21 DIAGNOSIS — E109 Type 1 diabetes mellitus without complications: Secondary | ICD-10-CM | POA: Diagnosis not present

## 2017-08-26 DIAGNOSIS — J029 Acute pharyngitis, unspecified: Secondary | ICD-10-CM | POA: Diagnosis not present

## 2017-08-26 DIAGNOSIS — Z20828 Contact with and (suspected) exposure to other viral communicable diseases: Secondary | ICD-10-CM | POA: Diagnosis not present

## 2017-08-26 DIAGNOSIS — E1065 Type 1 diabetes mellitus with hyperglycemia: Secondary | ICD-10-CM | POA: Diagnosis not present

## 2017-08-31 DIAGNOSIS — J029 Acute pharyngitis, unspecified: Secondary | ICD-10-CM | POA: Diagnosis not present

## 2018-02-13 DIAGNOSIS — G43009 Migraine without aura, not intractable, without status migrainosus: Secondary | ICD-10-CM | POA: Diagnosis not present

## 2018-03-17 DIAGNOSIS — G43009 Migraine without aura, not intractable, without status migrainosus: Secondary | ICD-10-CM | POA: Diagnosis not present

## 2018-03-23 DIAGNOSIS — Z01419 Encounter for gynecological examination (general) (routine) without abnormal findings: Secondary | ICD-10-CM | POA: Diagnosis not present

## 2018-05-12 DIAGNOSIS — R0981 Nasal congestion: Secondary | ICD-10-CM | POA: Diagnosis not present

## 2018-05-12 DIAGNOSIS — J069 Acute upper respiratory infection, unspecified: Secondary | ICD-10-CM | POA: Diagnosis not present

## 2018-05-12 DIAGNOSIS — R509 Fever, unspecified: Secondary | ICD-10-CM | POA: Diagnosis not present

## 2018-08-21 DIAGNOSIS — R1032 Left lower quadrant pain: Secondary | ICD-10-CM | POA: Diagnosis not present

## 2018-08-25 DIAGNOSIS — Z794 Long term (current) use of insulin: Secondary | ICD-10-CM | POA: Diagnosis not present

## 2018-08-25 DIAGNOSIS — E1022 Type 1 diabetes mellitus with diabetic chronic kidney disease: Secondary | ICD-10-CM | POA: Diagnosis not present

## 2018-08-25 DIAGNOSIS — E109 Type 1 diabetes mellitus without complications: Secondary | ICD-10-CM | POA: Diagnosis not present

## 2018-08-25 DIAGNOSIS — N181 Chronic kidney disease, stage 1: Secondary | ICD-10-CM | POA: Diagnosis not present

## 2018-10-03 DIAGNOSIS — J019 Acute sinusitis, unspecified: Secondary | ICD-10-CM | POA: Diagnosis not present

## 2018-10-06 DIAGNOSIS — E1022 Type 1 diabetes mellitus with diabetic chronic kidney disease: Secondary | ICD-10-CM | POA: Diagnosis not present

## 2018-10-06 DIAGNOSIS — Z794 Long term (current) use of insulin: Secondary | ICD-10-CM | POA: Diagnosis not present

## 2018-10-06 DIAGNOSIS — E109 Type 1 diabetes mellitus without complications: Secondary | ICD-10-CM | POA: Diagnosis not present

## 2018-10-10 DIAGNOSIS — E119 Type 2 diabetes mellitus without complications: Secondary | ICD-10-CM | POA: Diagnosis not present

## 2019-03-07 DIAGNOSIS — E1022 Type 1 diabetes mellitus with diabetic chronic kidney disease: Secondary | ICD-10-CM | POA: Diagnosis not present

## 2019-03-07 DIAGNOSIS — Z794 Long term (current) use of insulin: Secondary | ICD-10-CM | POA: Diagnosis not present

## 2019-03-07 DIAGNOSIS — E109 Type 1 diabetes mellitus without complications: Secondary | ICD-10-CM | POA: Diagnosis not present

## 2019-03-15 DIAGNOSIS — E109 Type 1 diabetes mellitus without complications: Secondary | ICD-10-CM | POA: Diagnosis not present

## 2019-04-11 DIAGNOSIS — R000 Tachycardia, unspecified: Secondary | ICD-10-CM | POA: Diagnosis not present

## 2019-04-11 DIAGNOSIS — G43009 Migraine without aura, not intractable, without status migrainosus: Secondary | ICD-10-CM | POA: Diagnosis not present

## 2019-04-23 DIAGNOSIS — E109 Type 1 diabetes mellitus without complications: Secondary | ICD-10-CM | POA: Diagnosis not present

## 2020-01-18 ENCOUNTER — Encounter: Payer: 59 | Attending: Internal Medicine | Admitting: Dietician

## 2020-01-18 DIAGNOSIS — E1043 Type 1 diabetes mellitus with diabetic autonomic (poly)neuropathy: Secondary | ICD-10-CM | POA: Insufficient documentation

## 2020-01-24 ENCOUNTER — Ambulatory Visit: Payer: 59 | Admitting: Dietician

## 2020-01-28 ENCOUNTER — Other Ambulatory Visit: Payer: Self-pay

## 2020-01-28 ENCOUNTER — Encounter: Payer: Self-pay | Admitting: Dietician

## 2020-01-28 ENCOUNTER — Encounter: Payer: 59 | Admitting: Dietician

## 2020-01-28 DIAGNOSIS — E1043 Type 1 diabetes mellitus with diabetic autonomic (poly)neuropathy: Secondary | ICD-10-CM | POA: Diagnosis not present

## 2020-01-28 NOTE — Patient Instructions (Signed)
Continue small frequent meals Have a small amount of protein with each meal or snack. Consider alternate forms of protein if you are unable to tolerate meat (vege burgers or vege chicken nuggets, beans, cheese, hummus, nuts, peanut butter, yogurt). How can you remember to take put your insulin into your pump prior to your meal/snack?

## 2020-01-28 NOTE — Progress Notes (Signed)
Diabetes Self-Management Education  Visit Type: First/Initial  Appt. Start Time: 1630 Appt. End Time: 1730  02/03/2020  [name], identified by name and date of birth, is a 29 y.o. female with a diagnosis of Diabetes: Type 1.   ASSESSMENT Patient is here today alone. She would like to learn how to use her Control IQ.  She states that she upgraded to the new pump and does not know how to use this.  Uses a Dexcom G-6 Appointment made with a [employer] trainer.  (Noted that she did not show up for this appointment and the trainer has left a message to talk her through this over the phone.)  Patient has been having increased morning sickness (all day). She would like tips about how to eat with morning sickness.  Patient is currently [redacted] weeks gestation.  History includes Type 1 Diabetes She reports weight of 158 lbs prior to pregnancy and is concerned with her weight gain thus far.  164 lbs today. Medications include Lyemjev via her Tandem pump.  She forgets to enter carbs frequently and does this often after meals as a result. A1C 10.4% 03/15/2019.  She reports that it was 7.3% at [facility] 2 weeks ago and has decreased from 8.4%.  Patient lives with her husband and mother and father in law as they are currently working on building a house. Her first child was trisomy 41, second child born at 9 weeks and complications during delivery and child had sepsis and died on day 3.  Her 3rd child [name] is 3. She dislikes eggs and milk, loves cheese.  She will eat a scrambled egg sandwich occasionally. She states that she feels very comfortable with carb counting.   Height 5' 1.75" (1.568 m), weight 164 lb (74.4 kg), unknown if currently breastfeeding. Body mass index is 30.24 kg/m.  Diabetes Self-Management Education - 01/28/20 1657      Visit Information   Visit Type  First/Initial      Initial Visit   Diabetes Type  Type 1    Are you currently following a meal plan?  Yes    What type of  meal plan do you follow?  carbohydrate counting    Are you taking your medications as prescribed?  Yes    Date Diagnosed  2001      Health Coping   How would you rate your overall health?  Good      Psychosocial Assessment   Patient Belief/Attitude about Diabetes  Motivated to manage diabetes    Self-care barriers  None    Self-management support  Doctor's office;Family    Other persons present  Patient    Patient Concerns  Nutrition/Meal planning    Special Needs  None    Preferred Learning Style  No preference indicated    Learning Readiness  Ready    How often do you need to have someone help you when you read instructions, pamphlets, or other written materials from your doctor or pharmacy?  1 - Never    What is the last grade level you completed in school?  college grad (associates degree- Engineer, site)      Pre-Education Assessment   Patient understands the diabetes disease and treatment process.  Needs Review    Patient understands incorporating nutritional management into lifestyle.  Needs Review    Patient undertands incorporating physical activity into lifestyle.  Needs Review    Patient understands using medications safely.  Needs Review    Patient understands monitoring  blood glucose, interpreting and using results  Needs Review    Patient understands prevention, detection, and treatment of acute complications.  Needs Review    Patient understands prevention, detection, and treatment of chronic complications.  Needs Review    Patient understands how to develop strategies to address psychosocial issues.  Needs Review    Patient understands how to develop strategies to promote health/change behavior.  Needs Review      Complications   Last HgB A1C per patient/outside source  7.3 %   01/2020   How often do you check your blood sugar?  > 4 times/day    Fasting Blood glucose range (mg/dL)  70-129    Postprandial Blood glucose range (mg/dL)  >200    Number of hypoglycemic  episodes per month  3   multiple times   Can you tell when your blood sugar is low?  Yes   sometimes hypoglycemia unaware   What do you do if your blood sugar is low?  apple juice and peanut butter crackers OR at night will eat more than that    Have you had a dilated eye exam in the past 12 months?  Yes    Have you had a dental exam in the past 12 months?  No    Are you checking your feet?  Yes    How many days per week are you checking your feet?  7      Dietary Intake   Breakfast  1/2 cup apple juice, spoons of peanut butter, mini pancakes ([employer]) with sugar free syrup OR Blueberry Cheerios and spoons of peanut butter    Snack (morning)  grazes on breakfast    Lunch  PB or [place] and cheese sandwich, pretzels occasionally OR leftovers    Snack (afternoon)  NABS or spoon of peanut butter or nuts    Dinner  meat, potatoes, vegetables OR salad OR taco bar "currently averse to meat" and "picks at food"    Snack (evening)  NABS, mini pancakes    Beverage(s)  water, small amounts of apple juice, occasional diet coke or diet gingerale      Exercise   Exercise Type  Light (walking / raking leaves)   used to run prior to morningsickness   How many days per week to you exercise?  2    How many minutes per day do you exercise?  60    Total minutes per week of exercise  120      Patient Education   Previous Diabetes Education  Yes (please comment)   NDES   Nutrition management   Meal timing in regards to the patients' current diabetes medication.    Medications  Reviewed patients medication for diabetes, action, purpose, timing of dose and side effects.    Monitoring  Identified appropriate SMBG and/or A1C goals.    Acute complications  Taught treatment of hypoglycemia - the 15 rule.    Preconception care  Reviewed with patient blood glucose goals with pregnancy      Individualized Goals (developed by patient)   Nutrition  Other (comment)   carbohydrate counting   Physical Activity   Exercise 3-5 times per week;30 minutes per day    Medications  take my medication as prescribed    Monitoring   test my blood glucose as discussed;Other (comment)    Reducing Risk  examine blood glucose patterns    Health Coping  discuss diabetes with (comment)   MD, RD, CDE  Post-Education Assessment   Patient understands the diabetes disease and treatment process.  Demonstrates understanding / competency    Patient understands incorporating nutritional management into lifestyle.  Demonstrates understanding / competency    Patient undertands incorporating physical activity into lifestyle.  Demonstrates understanding / competency    Patient understands using medications safely.  Demonstrates understanding / competency    Patient understands monitoring blood glucose, interpreting and using results  Demonstrates understanding / competency    Patient understands prevention, detection, and treatment of acute complications.  Demonstrates understanding / competency    Patient understands prevention, detection, and treatment of chronic complications.  Demonstrates understanding / competency    Patient understands how to develop strategies to address psychosocial issues.  Demonstrates understanding / competency    Patient understands how to develop strategies to promote health/change behavior.  Demonstrates understanding / competency      Outcomes   Expected Outcomes  Demonstrated interest in learning. Expect positive outcomes    Future DMSE  PRN    Program Status  Completed       Individualized Plan for Diabetes Self-Management Training:   Learning Objective:  Patient will have a greater understanding of diabetes self-management. Patient education plan is to attend individual and/or group sessions per assessed needs and concerns.   Plan:   Patient Instructions  Continue small frequent meals Have a small amount of protein with each meal or snack. Consider alternate forms of protein if  you are unable to tolerate meat (vege burgers or vege chicken nuggets, beans, cheese, hummus, nuts, peanut butter, yogurt). How can you remember to take put your insulin into your pump prior to your meal/snack?   Expected Outcomes:  Demonstrated interest in learning. Expect positive outcomes  Education material provided: Diabetes and pregnancy; Nutrition therapy for morning sickness from AND  If problems or questions, patient to contact team via:  Phone and Email  Future DSME appointment: PRN

## 2020-01-30 ENCOUNTER — Telehealth: Payer: Self-pay | Admitting: Nutrition

## 2020-01-30 ENCOUNTER — Encounter: Payer: 59 | Admitting: Nutrition

## 2020-01-30 NOTE — Telephone Encounter (Signed)
Patient did not show for appointment. Message left that if she wants to upgrade, she needs to call me so I can get a script from Dr. Sharl Ma for her so that she can do this via the internet.  Advised her to call me with this information, and I can get the paperwork started and give her the steps to follow to upgrade her pump.

## 2020-01-30 NOTE — Telephone Encounter (Signed)
After receiving a call from the [employer] representative, he informed me that she has a control IQ pump.  I phoned her back and told her to call me and I will talk her through the steps to use the control IQ.

## 2020-01-30 NOTE — Telephone Encounter (Signed)
Opened in error

## 2020-02-03 ENCOUNTER — Encounter: Payer: Self-pay | Admitting: Dietician

## 2020-02-06 ENCOUNTER — Telehealth: Payer: Self-pay | Admitting: Nutrition

## 2020-02-06 NOTE — Telephone Encounter (Signed)
Message left on voice mail, I am available to help her with control IQ, but cautioned her that the target blood sugar is set to 110 for control IQ, and would need verification from MD that this is ok to start this.

## 2024-07-21 ENCOUNTER — Encounter (HOSPITAL_COMMUNITY): Payer: Self-pay | Admitting: Pharmacy Technician

## 2024-07-21 ENCOUNTER — Other Ambulatory Visit: Payer: Self-pay

## 2024-07-21 ENCOUNTER — Emergency Department (HOSPITAL_COMMUNITY): Admission: EM | Admit: 2024-07-21 | Discharge: 2024-07-21 | Disposition: A | Discharge status: Home / Self Care

## 2024-07-21 DIAGNOSIS — R1110 Vomiting, unspecified: Secondary | ICD-10-CM

## 2024-07-21 DIAGNOSIS — R197 Diarrhea, unspecified: Secondary | ICD-10-CM | POA: Insufficient documentation

## 2024-07-21 DIAGNOSIS — R000 Tachycardia, unspecified: Secondary | ICD-10-CM | POA: Insufficient documentation

## 2024-07-21 DIAGNOSIS — Z794 Long term (current) use of insulin: Secondary | ICD-10-CM | POA: Diagnosis not present

## 2024-07-21 DIAGNOSIS — E860 Dehydration: Secondary | ICD-10-CM | POA: Diagnosis present

## 2024-07-21 DIAGNOSIS — E1010 Type 1 diabetes mellitus with ketoacidosis without coma: Secondary | ICD-10-CM | POA: Insufficient documentation

## 2024-07-21 LAB — URINALYSIS, ROUTINE W REFLEX MICROSCOPIC
Bilirubin Urine: NEGATIVE
Glucose, UA: >=500 mg/dL — AB
Hgb urine dipstick: NEGATIVE
Ketones, ur: 80 mg/dL — AB
Leukocytes,Ua: NEGATIVE
Nitrite: NEGATIVE
Protein, ur: 100 mg/dL — AB
Specific Gravity, Urine: 1.018 (ref 1.005–1.030)
pH: 5 (ref 5.0–8.0)

## 2024-07-21 LAB — COMPREHENSIVE METABOLIC PANEL WITH GFR
ALT: 18 U/L (ref 0–44)
AST: 18 U/L (ref 15–41)
Albumin: 4.1 g/dL (ref 3.5–5.0)
Alkaline Phosphatase: 79 U/L (ref 38–126)
Anion gap: 19 — ABNORMAL HIGH (ref 5–15)
BUN: 19 mg/dL (ref 6–20)
CO2: 12 mmol/L — ABNORMAL LOW (ref 22–32)
Calcium: 9.2 mg/dL (ref 8.9–10.3)
Chloride: 102 mmol/L (ref 98–111)
Creatinine, Ser: 1.21 mg/dL — ABNORMAL HIGH (ref 0.44–1.00)
GFR, Estimated: >60 mL/min (ref >60)
Glucose, Bld: 245 mg/dL — ABNORMAL HIGH (ref 70–99)
Potassium: 4.3 mmol/L (ref 3.5–5.1)
Sodium: 133 mmol/L — ABNORMAL LOW (ref 135–145)
Total Bilirubin: 1.7 mg/dL — ABNORMAL HIGH (ref 0.0–1.2)
Total Protein: 7.9 g/dL (ref 6.5–8.1)

## 2024-07-21 LAB — CBC
HCT: 48.3 % — ABNORMAL HIGH (ref 36.0–46.0)
Hemoglobin: 15.6 g/dL — ABNORMAL HIGH (ref 12.0–15.0)
MCH: 31 pg (ref 26.0–34.0)
MCHC: 32.3 g/dL (ref 30.0–36.0)
MCV: 95.8 fL (ref 80.0–100.0)
Platelets: 271 K/uL (ref 150–400)
RBC: 5.04 MIL/uL (ref 3.87–5.11)
RDW: 11.6 % (ref 11.5–15.5)
WBC: 10.4 K/uL (ref 4.0–10.5)
nRBC: 0 % (ref 0.0–0.2)

## 2024-07-21 LAB — BASIC METABOLIC PANEL WITH GFR
Anion gap: 16 — ABNORMAL HIGH (ref 5–15)
BUN: 17 mg/dL (ref 6–20)
CO2: 14 mmol/L — ABNORMAL LOW (ref 22–32)
Calcium: 9 mg/dL (ref 8.9–10.3)
Chloride: 105 mmol/L (ref 98–111)
Creatinine, Ser: 0.93 mg/dL (ref 0.44–1.00)
GFR, Estimated: >60 mL/min (ref >60)
Glucose, Bld: 188 mg/dL — ABNORMAL HIGH (ref 70–99)
Potassium: 4.1 mmol/L (ref 3.5–5.1)
Sodium: 135 mmol/L (ref 135–145)

## 2024-07-21 LAB — BLOOD GAS, VENOUS
Acid-base deficit: 13.4 mmol/L — ABNORMAL HIGH (ref 0.0–2.0)
Acid-base deficit: 11 mmol/L — ABNORMAL HIGH (ref 0.0–2.0)
Bicarbonate: 13.7 mmol/L — ABNORMAL LOW (ref 20.0–28.0)
Bicarbonate: 15.3 mmol/L — ABNORMAL LOW (ref 20.0–28.0)
O2 Saturation: 48.9 %
O2 Saturation: 32.3 %
Patient temperature: 37
Patient temperature: 37
pCO2, Ven: 35 mmHg — ABNORMAL LOW (ref 44–60)
pCO2, Ven: 35 mmHg — ABNORMAL LOW (ref 44–60)
pH, Ven: 7.2 — ABNORMAL LOW (ref 7.25–7.43)
pH, Ven: 7.25 (ref 7.25–7.43)
pO2, Ven: <31 mmHg — CL (ref 32–45)
pO2, Ven: <31 mmHg — CL (ref 32–45)

## 2024-07-21 LAB — LIPASE, BLOOD: Lipase: 25 U/L (ref 11–51)

## 2024-07-21 LAB — HCG, SERUM, QUALITATIVE: Preg, Serum: NEGATIVE

## 2024-07-21 MED ORDER — ONDANSETRON HCL 4 MG/2ML IJ SOLN
4.0000 mg | Freq: Once | INTRAMUSCULAR | Status: AC
  Administered 2024-07-21: 4 mg via INTRAVENOUS
  Filled 2024-07-21: qty 2

## 2024-07-21 MED ORDER — LACTATED RINGERS IV BOLUS
1000.0000 mL | Freq: Once | INTRAVENOUS | Status: AC
  Administered 2024-07-21: 1000 mL via INTRAVENOUS

## 2024-07-21 NOTE — Discharge Instructions (Signed)
 Continue to drink lots of fluids.  Use your Zofran as needed.  Keep a close eye on your blood sugar and return for any new or worsening symptoms.  Follow-up with primary care next week.

## 2024-07-21 NOTE — ED Provider Notes (Signed)
 [place] EMERGENCY DEPARTMENT AT [facility] Provider Note   CSN: [id number] Arrival date & time: 07/21/24  9168     Patient presents with: Diarrhea and Emesis   [name] is a 33 y.o. female.   33 year old female presents for evaluation of dehydration.  States she has a history of DKA and she has had diarrhea for 5 days and then started vomiting today.  States last 2 days she has had glucoses in the 200s to 300s.  She states normally her A1c is around 5.8.  She has an insulin pump.  She denies any other symptoms or concerns at this time.   Diarrhea Associated symptoms: vomiting   Associated symptoms: no abdominal pain, no arthralgias, no chills and no fever   Emesis Associated symptoms: diarrhea   Associated symptoms: no abdominal pain, no arthralgias, no chills, no cough, no fever and no sore throat        Prior to Admission medications   Medication Sig Start Date End Date Taking? Authorizing Provider  busPIRone (BUSPAR) 7.5 MG tablet Take 7.5 mg by mouth 3 (three) times daily.    [provider]  butalbital-acetaminophen-caffeine (FIORICET, ESGIC) 50-325-40 MG tablet Take 1 tablet by mouth 2 (two) times daily as needed for headache.    [provider]  EPINEPHrine (EPI-PEN) 0.3 mg/0.3 mL SOAJ injection Inject into the muscle once.    [provider]  folic acid (FOLVITE) 800 MCG tablet Take 400 mcg by mouth daily.    [provider]  glucose blood (ONETOUCH VERIO) test strip Check blood sugar 10 x daily 03/14/13   Brennan, Michael J, MD  ibuprofen (ADVIL,MOTRIN) 600 MG tablet Take 1 tablet (600 mg total) by mouth every 6 (six) hours. Patient not taking: Reported on 01/28/2020 09/11/15   Delana Base Worema, DO  insulin lispro (HUMALOG) 100 UNIT/ML injection Inject into the skin every hour. Delivered via an insulin pump.    [provider]  Insulin Lispro-aabc (LYUMJEV) 100 UNIT/ML SOLN Inject as directed. Insulin  pump    [provider]  labetalol (NORMODYNE) 100 MG tablet Take 1 tablet (100 mg total) by mouth 3 (three) times daily. Patient not taking: Reported on 01/28/2020 09/11/15   Delana Base Worema, DO  ondansetron (ZOFRAN) 4 MG tablet Take 4 mg by mouth every 8 (eight) hours as needed for nausea or vomiting.    [provider]  ONE TOUCH ULTRA TEST test strip USE TO CHECK BLOOD SUGAR 6 TO 8 TIMES A DAY 07/20/12   Brennan, Michael J, MD  oxyCODONE-acetaminophen (PERCOCET/ROXICET) 5-325 MG tablet Take 1 tablet by mouth every 4 (four) hours as needed (for pain scale 4-7). Patient not taking: Reported on 01/28/2020 09/11/15   Delana Base Morrison, DO  Prenatal Vit-Fe Fumarate-FA (PRENATAL VITAMIN PO) Take 2 tablets by mouth daily.     [provider]  venlafaxine (EFFEXOR) 100 MG tablet Take 150 mg by mouth 1 day or 1 dose.    [provider]    Allergies: Pollen extract, Adhesive [tape], and Sulfa antibiotics    Review of Systems  Constitutional:  Negative for chills and fever.  HENT:  Negative for ear pain and sore throat.   Eyes:  Negative for pain and visual disturbance.  Respiratory:  Negative for cough and shortness of breath.   Cardiovascular:  Negative for chest pain and palpitations.  Gastrointestinal:  Positive for diarrhea and vomiting. Negative for abdominal pain.  Genitourinary:  Negative for dysuria and  hematuria.  Musculoskeletal:  Negative for arthralgias and back pain.  Skin:  Negative for color change and rash.  Neurological:  Negative for seizures and syncope.  All other systems reviewed and are negative.   Updated Vital Signs BP 126/76   Pulse 95   Temp (!) 97.4 F (36.3 C)   Resp 17   SpO2 100%   Physical Exam Vitals and nursing note reviewed.  Constitutional:      General: She is not in acute distress.    Appearance: She is well-developed. She is not ill-appearing.  HENT:     Head: Normocephalic and atraumatic.      Mouth/Throat:     Mouth: Mucous membranes are dry.  Eyes:     Conjunctiva/sclera: Conjunctivae normal.  Cardiovascular:     Rate and Rhythm: Regular rhythm. Tachycardia present.     Pulses: Normal pulses.     Heart sounds: Normal heart sounds. No murmur heard. Pulmonary:     Effort: Pulmonary effort is normal. No respiratory distress.     Breath sounds: Normal breath sounds.  Abdominal:     General: There is no distension.     Palpations: Abdomen is soft. There is no mass.     Tenderness: There is no abdominal tenderness.     Hernia: No hernia is present.  Musculoskeletal:        General: No swelling.     Cervical back: Neck supple.  Skin:    General: Skin is warm and dry.     Capillary Refill: Capillary refill takes less than 2 seconds.  Neurological:     Mental Status: She is alert.  Psychiatric:        Mood and Affect: Mood normal.     (all labs ordered are listed, but only abnormal results are displayed) Labs Reviewed  COMPREHENSIVE METABOLIC PANEL WITH GFR - Abnormal; Notable for the following components:      Result Value   Sodium 133 (*)    CO2 12 (*)    Glucose, Bld 245 (*)    Creatinine, Ser 1.21 (*)    Total Bilirubin 1.7 (*)    Anion gap 19 (*)    All other components within normal limits  CBC - Abnormal; Notable for the following components:   Hemoglobin 15.6 (*)    HCT 48.3 (*)    All other components within normal limits  URINALYSIS, ROUTINE W REFLEX MICROSCOPIC - Abnormal; Notable for the following components:   Glucose, UA >=500 (*)    Ketones, ur 80 (*)    Protein, ur 100 (*)    Bacteria, UA RARE (*)    All other components within normal limits  BLOOD GAS, VENOUS - Abnormal; Notable for the following components:   pH, Ven 7.2 (*)    pCO2, Ven 35 (*)    pO2, Ven <31 (*)    Bicarbonate 13.7 (*)    Acid-base deficit 13.4 (*)    All other components within normal limits  BASIC METABOLIC PANEL WITH GFR - Abnormal; Notable for the following  components:   CO2 14 (*)    Glucose, Bld 188 (*)    Anion gap 16 (*)    All other components within normal limits  BLOOD GAS, VENOUS - Abnormal; Notable for the following components:   pCO2, Ven 35 (*)    pO2, Ven <31 (*)    Bicarbonate 15.3 (*)    Acid-base deficit 11.0 (*)    All other components within normal limits  LIPASE, BLOOD  HCG, SERUM, QUALITATIVE  CBG MONITORING, ED    EKG: None  Radiology: No results found.   Procedures   Medications Ordered in the ED  lactated ringers bolus 1,000 mL (0 mLs Intravenous Stopped 07/21/24 1221)  ondansetron (ZOFRAN) injection 4 mg (4 mg Intravenous Given 07/21/24 1028)  lactated ringers bolus 1,000 mL (0 mLs Intravenous Stopped 07/21/24 1221)                                    Medical Decision Making Cardiac monitor interpretation: Sinus tachycardia improved to sinus rhythm without ectopy  Surgery tolerance of health: Patient decided to go home despite recommendations for admission  Patient's lab workup reviewed by me and she does have DKA.  Did improve after 2 L of IV fluids but still with evidence of some acidosis and anion gap.  Patient does not want to stay in the hospital.  She has an insulin pump and feels much better.  She states this happens very frequently and usually she goes home and does feel better after a day or 2.  Will drink increased fluids and I gave her prescription for Zofran to use as needed for nausea and vomiting.  Advise close follow-up with primary care and given very strict return precautions.  Her and her husband understand the risks of going home and feel comfortable with this plan and will return should she get worse.  Problems Addressed: Diabetic ketoacidosis without coma associated with type 1 diabetes mellitus (HCC): acute illness or injury that poses a threat to life or bodily functions Vomiting and diarrhea: acute illness or injury  Amount and/or Complexity of Data Reviewed External Data Reviewed:  notes.    Details: Prior records reviewed and patient with history of DKA in the past after COVID infection Labs: ordered. Decision-making details documented in ED Course.    Details: Ordered and reviewed by me and patient does have evidence of DKA ECG/medicine tests: ordered and independent interpretation performed. Decision-making details documented in ED Course.    Details: Ordered and interpreted by me in the absence of cardiology and shows sinus tachycardia, no STEMI noted, no acute change when compared to prior EKG   Risk OTC drugs. Prescription drug management. Drug therapy requiring intensive monitoring for toxicity. Decision regarding hospitalization. Diagnosis or treatment significantly limited by social determinants of health.    Final diagnoses:  Diabetic ketoacidosis without coma associated with type 1 diabetes mellitus (HCC)  Vomiting and diarrhea    ED Discharge Orders     None          Gennaro Duwaine CROME, DO 07/21/24 1532

## 2024-07-21 NOTE — ED Triage Notes (Signed)
 Pt here with complaints of 5 days of diarrhea. This morning pt started vomiting, now feeling dizzy and dehydrated. Hx type 1 DM, blood sugar has been running in the 200's today, unable to get it to go down.
